# Patient Record
Sex: Male | Born: 1985
Health system: Southern US, Community
[De-identification: ages and names within clinical notes are randomized; demographics above are authoritative.]

## PROBLEM LIST (undated history)

## (undated) DIAGNOSIS — D693 Immune thrombocytopenic purpura: Secondary | ICD-10-CM

## (undated) DIAGNOSIS — G4730 Sleep apnea, unspecified: Secondary | ICD-10-CM

## (undated) DIAGNOSIS — Z789 Other specified health status: Secondary | ICD-10-CM

## (undated) DIAGNOSIS — D689 Coagulation defect, unspecified: Secondary | ICD-10-CM

## (undated) HISTORY — DX: Other specified health status: Z78.9

## (undated) HISTORY — DX: Coagulation defect, unspecified: D68.9

## (undated) HISTORY — DX: Sleep apnea, unspecified: G47.30

## (undated) HISTORY — PX: TONSILLECTOMY: SUR1361

## (undated) HISTORY — DX: Immune thrombocytopenic purpura: D69.3

---
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
## 2009-01-17 ENCOUNTER — Emergency Department (HOSPITAL_COMMUNITY): Admission: EM | Admit: 2009-01-17 | Discharge: 2009-01-18 | Payer: Self-pay | Admitting: Emergency Medicine

## 2009-01-17 ENCOUNTER — Emergency Department (HOSPITAL_COMMUNITY): Admission: EM | Admit: 2009-01-17 | Discharge: 2009-01-17 | Payer: Self-pay | Admitting: Emergency Medicine

## 2009-01-22 ENCOUNTER — Ambulatory Visit (HOSPITAL_COMMUNITY): Admission: RE | Admit: 2009-01-22 | Discharge: 2009-01-22 | Payer: Self-pay | Admitting: Family Medicine

## 2010-11-22 LAB — CBC
HCT: 42.2 % (ref 39.0–52.0)
MCV: 84 fL (ref 78.0–100.0)
RBC: 5.02 MIL/uL (ref 4.22–5.81)
WBC: 9.7 10*3/uL (ref 4.0–10.5)

## 2010-11-22 LAB — COMPREHENSIVE METABOLIC PANEL
AST: 24 U/L (ref 0–37)
BUN: 10 mg/dL (ref 6–23)
CO2: 25 mEq/L (ref 19–32)
Chloride: 106 mEq/L (ref 96–112)
Creatinine, Ser: 0.9 mg/dL (ref 0.4–1.5)
GFR calc non Af Amer: >60 mL/min (ref >60)
Total Bilirubin: 0.8 mg/dL (ref 0.3–1.2)

## 2010-11-22 LAB — DIFFERENTIAL
Basophils Absolute: 0 10*3/uL (ref 0.0–0.1)
Basophils Relative: 0 % (ref 0–1)
Eosinophils Relative: 0 % (ref 0–5)
Lymphocytes Relative: 6 % — ABNORMAL LOW (ref 12–46)
Neutro Abs: 8.7 10*3/uL — ABNORMAL HIGH (ref 1.7–7.7)

## 2010-11-22 LAB — URINALYSIS, ROUTINE W REFLEX MICROSCOPIC
Bilirubin Urine: NEGATIVE
Ketones, ur: NEGATIVE mg/dL
Nitrite: NEGATIVE
Specific Gravity, Urine: 1.02 (ref 1.005–1.030)
Urobilinogen, UA: 0.2 mg/dL (ref 0.0–1.0)

## 2010-11-22 LAB — LIPASE, BLOOD: Lipase: 19 U/L (ref 11–59)

## 2014-03-20 ENCOUNTER — Encounter: Payer: Self-pay | Admitting: Family Medicine

## 2017-08-30 ENCOUNTER — Ambulatory Visit (INDEPENDENT_AMBULATORY_CARE_PROVIDER_SITE_OTHER): Payer: BLUE CROSS/BLUE SHIELD | Admitting: Family Medicine

## 2017-08-30 ENCOUNTER — Encounter: Payer: Self-pay | Admitting: Family Medicine

## 2017-08-30 VITALS — BP 128/80

## 2017-08-30 DIAGNOSIS — Z0000 Encounter for general adult medical examination without abnormal findings: Secondary | ICD-10-CM

## 2017-08-30 DIAGNOSIS — R748 Abnormal levels of other serum enzymes: Secondary | ICD-10-CM

## 2017-08-30 NOTE — Progress Notes (Signed)
   Subjective:    Patient ID: Mark Curry, male    DOB: 05/26/1986, 32 y.o.   MRN: [id number]  HPI The patient comes in today for a wellness visit.    A review of their health history was completed.  A review of medications was also completed.  Any needed refills; not taking any meds  Eating habits: health conscious  Falls/  MVA accidents in past few months: none  Regular exercise: occasionally walking  Specialist pt sees on regular basis: none  Preventative health issues were discussed.   Additional concerns: risk of metabolic syndrome Family history of insulin resistance Based upon his family history personal history he is at risk of diabetes but current lab work looks good   Review of Systems  Constitutional: Negative for activity change, appetite change and fever.  HENT: Negative for congestion and rhinorrhea.   Eyes: Negative for discharge.  Respiratory: Negative for cough and wheezing.   Cardiovascular: Negative for chest pain.  Gastrointestinal: Negative for abdominal pain, blood in stool and vomiting.  Genitourinary: Negative for difficulty urinating and frequency.  Musculoskeletal: Negative for neck pain.  Skin: Negative for rash.  Allergic/Immunologic: Negative for environmental allergies and food allergies.  Neurological: Negative for weakness and headaches.  Psychiatric/Behavioral: Negative for agitation.       Objective:   Physical Exam  Constitutional: He appears well-developed and well-nourished.  HENT:  Head: Normocephalic and atraumatic.  Right Ear: External ear normal.  Left Ear: External ear normal.  Nose: Nose normal.  Mouth/Throat: Oropharynx is clear and moist.  Eyes: EOM are normal. Pupils are equal, round, and reactive to light.  Neck: Normal range of motion. Neck supple. No thyromegaly present.  Cardiovascular: Normal rate, regular rhythm and normal heart sounds.  No murmur heard. Pulmonary/Chest: Effort normal and breath sounds  normal. No respiratory distress. He has no wheezes.  Abdominal: Soft. Bowel sounds are normal. He exhibits no distension and no mass. There is no tenderness.  Genitourinary: Penis normal.  Musculoskeletal: Normal range of motion. He exhibits no edema.  Lymphadenopathy:    He has no cervical adenopathy.  Neurological: He is alert. He exhibits normal muscle tone.  Skin: Skin is warm and dry. No erythema.  Psychiatric: He has a normal mood and affect. His behavior is normal. Judgment normal.     Genitourinary exam normal Prostate exam not indicated     Assessment & Plan:  Adult wellness-complete.wellness physical was conducted today. Importance of diet and exercise were discussed in detail. In addition to this a discussion regarding safety was also covered. We also reviewed over immunizations and gave recommendations regarding current immunization needed for age. In addition to this additional areas were also touched on including: Preventative health exams needed: Colonoscopy at age 32  Patient was advised yearly wellness exam We did discuss dietary measures regular physical exercise try to reduce weight screening labs through his workplace look good we will repeat additional lab work including kidney function liver function await the results  Patient will follow up on a yearly basis

## 2017-09-02 LAB — CBC WITH DIFFERENTIAL/PLATELET
BASOS ABS: 0 10*3/uL (ref 0.0–0.2)
Basos: 0 %
EOS (ABSOLUTE): 0.3 10*3/uL (ref 0.0–0.4)
Eos: 5 %
Hematocrit: 47.1 % (ref 37.5–51.0)
Hemoglobin: 15.7 g/dL (ref 13.0–17.7)
IMMATURE GRANS (ABS): 0 10*3/uL (ref 0.0–0.1)
Immature Granulocytes: 0 %
LYMPHS ABS: 2.5 10*3/uL (ref 0.7–3.1)
LYMPHS: 46 %
MCH: 29.1 pg (ref 26.6–33.0)
MCHC: 33.3 g/dL (ref 31.5–35.7)
MCV: 87 fL (ref 79–97)
Monocytes Absolute: 0.3 10*3/uL (ref 0.1–0.9)
Monocytes: 5 %
NEUTROS ABS: 2.4 10*3/uL (ref 1.4–7.0)
Neutrophils: 44 %
PLATELETS: 199 10*3/uL (ref 150–379)
RBC: 5.4 x10E6/uL (ref 4.14–5.80)
RDW: 13.5 % (ref 12.3–15.4)
WBC: 5.6 10*3/uL (ref 3.4–10.8)

## 2017-09-02 LAB — BASIC METABOLIC PANEL
BUN / CREAT RATIO: 13 (ref 9–20)
BUN: 11 mg/dL (ref 6–20)
CHLORIDE: 102 mmol/L (ref 96–106)
CO2: 23 mmol/L (ref 20–29)
CREATININE: 0.88 mg/dL (ref 0.76–1.27)
Calcium: 9 mg/dL (ref 8.7–10.2)
GFR calc non Af Amer: 115 mL/min/{1.73_m2} (ref >59)
GFR, EST AFRICAN AMERICAN: 133 mL/min/{1.73_m2} (ref >59)
Glucose: 92 mg/dL (ref 65–99)
Potassium: 4.7 mmol/L (ref 3.5–5.2)
Sodium: 140 mmol/L (ref 134–144)

## 2017-09-02 LAB — HEPATIC FUNCTION PANEL
ALBUMIN: 4.4 g/dL (ref 3.5–5.5)
ALK PHOS: 63 IU/L (ref 39–117)
ALT: 49 IU/L — AB (ref 0–44)
AST: 44 IU/L — AB (ref 0–40)
BILIRUBIN TOTAL: 0.6 mg/dL (ref 0.0–1.2)
BILIRUBIN, DIRECT: 0.18 mg/dL (ref 0.00–0.40)
Total Protein: 7 g/dL (ref 6.0–8.5)

## 2017-09-06 LAB — SPECIMEN STATUS REPORT

## 2017-09-09 LAB — SPECIMEN STATUS REPORT

## 2017-09-09 LAB — LIPID PANEL W/O CHOL/HDL RATIO
Cholesterol, Total: 118 mg/dL (ref 100–199)
HDL: 35 mg/dL — AB (ref >39)
LDL Calculated: 59 mg/dL (ref 0–99)
TRIGLYCERIDES: 120 mg/dL (ref 0–149)
VLDL Cholesterol Cal: 24 mg/dL (ref 5–40)

## 2017-09-11 NOTE — Addendum Note (Signed)
Addended by: Margaretha SheffieldBROWN, Jadia Capers S on: 09/11/2017 03:11 PM   Modules accepted: Orders

## 2017-09-18 NOTE — Addendum Note (Signed)
Addended by: Meredith LeedsSUTTON, CRYSTAL L on: 09/18/2017 08:42 AM   Modules accepted: Orders

## 2017-09-19 ENCOUNTER — Ambulatory Visit: Payer: Self-pay | Admitting: Allergy and Immunology

## 2017-09-25 ENCOUNTER — Ambulatory Visit (HOSPITAL_COMMUNITY): Payer: Self-pay

## 2017-10-02 ENCOUNTER — Ambulatory Visit (HOSPITAL_COMMUNITY)
Admission: RE | Admit: 2017-10-02 | Discharge: 2017-10-02 | Disposition: A | Discharge status: Home / Self Care | Payer: BLUE CROSS/BLUE SHIELD | Source: Ambulatory Visit | Attending: Family Medicine | Admitting: Family Medicine

## 2017-10-02 DIAGNOSIS — K760 Fatty (change of) liver, not elsewhere classified: Secondary | ICD-10-CM | POA: Diagnosis not present

## 2017-10-02 DIAGNOSIS — R748 Abnormal levels of other serum enzymes: Secondary | ICD-10-CM | POA: Diagnosis present

## 2017-10-09 ENCOUNTER — Ambulatory Visit: Payer: BLUE CROSS/BLUE SHIELD | Admitting: Family Medicine

## 2017-10-09 ENCOUNTER — Encounter: Payer: Self-pay | Admitting: Family Medicine

## 2017-10-09 VITALS — BP 122/78 | Temp 98.7°F | Ht 72.0 in | Wt 287.0 lb

## 2017-10-09 DIAGNOSIS — K760 Fatty (change of) liver, not elsewhere classified: Secondary | ICD-10-CM | POA: Diagnosis not present

## 2017-10-09 DIAGNOSIS — J019 Acute sinusitis, unspecified: Secondary | ICD-10-CM

## 2017-10-09 MED ORDER — AMOXICILLIN 500 MG PO TABS: 500.0000 mg | ORAL_TABLET | Freq: Three times a day (TID) | ORAL | 0 refills | Status: DC

## 2017-10-09 NOTE — Progress Notes (Signed)
Discussed at office visit.

## 2017-10-09 NOTE — Progress Notes (Signed)
   Subjective:    Patient ID: Mark Curry, male    DOB: 04/26/1986, 32 y.o.   MRN: [id number]  HPIGo over ultrasound results.  Patient moderately overweight Patient does not drink alcohol Only occasional alcohol use Denies overeating but does state that his dietary measures could be better does relate a lot of sinus pressure pain discomfort drainage not feeling good symptoms over the past couple days Sinus pressure, general weakness, sore throat. Started 2 days ago.   Family history of diabetes  Review of Systems  Constitutional: Negative for activity change, chills and fever.  HENT: Positive for congestion, rhinorrhea, sinus pressure and sinus pain. Negative for ear pain.   Eyes: Negative for discharge.  Respiratory: Positive for cough. Negative for wheezing.   Cardiovascular: Negative for chest pain.  Gastrointestinal: Negative for nausea and vomiting.  Musculoskeletal: Negative for arthralgias.       Objective:   Physical Exam  Constitutional: He appears well-developed.  HENT:  Head: Normocephalic and atraumatic.  Mouth/Throat: Oropharynx is clear and moist. No oropharyngeal exudate.  Eyes: Right eye exhibits no discharge. Left eye exhibits no discharge.  Neck: Normal range of motion.  Cardiovascular: Normal rate, regular rhythm and normal heart sounds.  No murmur heard. Pulmonary/Chest: Effort normal and breath sounds normal. No respiratory distress. He has no wheezes. He has no rales.  Lymphadenopathy:    He has no cervical adenopathy.  Neurological: He exhibits normal muscle tone.  Skin: Skin is warm and dry.  Nursing note and vitals reviewed.         Assessment & Plan:  We discussed fatty liver This healthy eating discussed Importance of losing weight Follow-up liver profile in approximately 5 months If still elevated will need additional lab work to rule out other more rare causes of elevated liver enzymes Patient was cautioned that addressed fatty liver  can significantly increase the risk of cirrhosis over time  Sinusitis antibiotic prescribed warnings discussed follow-up if problems

## 2017-10-31 ENCOUNTER — Ambulatory Visit: Payer: Self-pay | Admitting: Allergy and Immunology

## 2017-11-06 ENCOUNTER — Ambulatory Visit: Payer: BLUE CROSS/BLUE SHIELD | Admitting: Allergy and Immunology

## 2017-11-06 ENCOUNTER — Encounter: Payer: Self-pay | Admitting: Allergy and Immunology

## 2017-11-06 VITALS — BP 120/78 | HR 70 | Temp 97.9°F | Resp 16 | Ht 70.0 in | Wt 289.4 lb

## 2017-11-06 DIAGNOSIS — R430 Anosmia: Secondary | ICD-10-CM | POA: Insufficient documentation

## 2017-11-06 DIAGNOSIS — J3089 Other allergic rhinitis: Secondary | ICD-10-CM

## 2017-11-06 DIAGNOSIS — J309 Allergic rhinitis, unspecified: Secondary | ICD-10-CM | POA: Insufficient documentation

## 2017-11-06 MED ORDER — CARBINOXAMINE MALEATE 6 MG PO TABS: 1.0000 | ORAL_TABLET | ORAL | 5 refills | Status: DC

## 2017-11-06 MED ORDER — FLUTICASONE PROPIONATE 93 MCG/ACT NA EXHU: 2.0000 | INHALANT_SUSPENSION | Freq: Two times a day (BID) | NASAL | 5 refills | Status: DC

## 2017-11-06 NOTE — Patient Instructions (Addendum)
Perennial and seasonal allergic rhinitis  Aeroallergen avoidance measures have been discussed and provided in written form.  A prescription has been provided for [facility], 2 actuations per nostril twice a day. Proper technique has been discussed and demonstrated.  Nasal saline spray (i.e., Simply Saline) or nasal saline lavage (i.e., NeilMed) is recommended as needed and prior to medicated nasal sprays.  A prescription has been provided for RyVent (carbinoxamine maleate) 6mg  every 6-8 hours as needed.  If allergen avoidance measures and medications fail to adequately relieve symptoms, aeroallergen immunotherapy will be considered.  Anosmia Partial anosmia.  Timmothy SoursXhance has been prescribed (as above).  If this problem persists or progresses, we will consider imaging..   Return in about 4 months (around 03/08/2018), or if symptoms worsen or fail to improve.

## 2017-11-06 NOTE — Assessment & Plan Note (Signed)
Partial anosmia.  Mark SoursXhance has been prescribed (as above).  If this problem persists or progresses, we will consider imaging..Marland Kitchen

## 2017-11-06 NOTE — Progress Notes (Signed)
New Patient Note  RE: BURDELL Curry MRN: [id number] DOB: 1986-07-10 Date of Office Visit: 11/06/2017  Referring provider: Babs Sciara, MD Primary care provider: Babs Sciara, MD  Chief Complaint: Nasal Congestion   History of present illness: Mark Curry is a 32 y.o. male seen today in consultation requested by Lilyan Punt, MD.  He experiences persistent nasal congestion, snoring, and occasional postnasal drainage.  No significant seasonal symptom variation has been noted nor have specific environmental triggers been identified.  He has tried some medicated nasal spray in the past but is currently not using a nasal spray.  He admits to a diminished sense of smell and taste.  He denies ocular symptoms, nasal pruritus, sneezing, and rhinorrhea.  Assessment and plan: Perennial and seasonal allergic rhinitis  Aeroallergen avoidance measures have been discussed and provided in written form.  A prescription has been provided for [facility], 2 actuations per nostril twice a day. Proper technique has been discussed and demonstrated.  Nasal saline spray (i.e., Simply Saline) or nasal saline lavage (i.e., NeilMed) is recommended as needed and prior to medicated nasal sprays.  A prescription has been provided for RyVent (carbinoxamine maleate) 6mg  every 6-8 hours as needed.  If allergen avoidance measures and medications fail to adequately relieve symptoms, aeroallergen immunotherapy will be considered.  Anosmia Partial anosmia.  Mark Curry has been prescribed (as above).  If this problem persists or progresses, we will consider imaging..   Meds ordered this encounter  Medications  . Fluticasone Propionate (XHANCE) 93 MCG/ACT EXHU    Sig: Place 2 sprays into both nostrils 2 (two) times daily.    Dispense:  32 mL    Refill:  5    [phone]  . Carbinoxamine Maleate (RYVENT) 6 MG TABS    Sig: Take 1 tablet by mouth See admin instructions. Every 6-8 hours as needed    Dispense:   30 tablet    Refill:  5    RUN AS CASH PAY ONLY! BIN  [id number]  RXPCN  [id number]  Group  [id number]  CardholderID  [id number]    Diagnostics: Epicutaneous testing: Negative despite a positive histamine control. Intradermal testing: Positive to grass pollen, molds, and dust mite antigen.    Physical examination: Blood pressure 120/78, pulse 70, temperature 97.9 F (36.6 C), temperature source Oral, resp. rate 16, height 5\' 10"  (1.778 m), weight 289 lb 6.4 oz (131.3 kg), SpO2 96 %.  General: Alert, interactive, in no acute distress. HEENT: TMs pearly gray, turbinates edematous without discharge, post-pharynx mildly erythematous. Neck: Supple without lymphadenopathy. Lungs: Clear to auscultation without wheezing, rhonchi or rales. CV: Normal S1, S2 without murmurs. Abdomen: Nondistended, nontender. Skin: Warm and dry, without lesions or rashes. Extremities:  No clubbing, cyanosis or edema. Neuro:   Grossly intact.  Review of systems:  Review of systems negative except as noted in HPI / PMHx or noted below: Review of Systems  Constitutional: Negative.   HENT: Negative.   Eyes: Negative.   Respiratory: Negative.   Cardiovascular: Negative.   Gastrointestinal: Negative.   Genitourinary: Negative.   Musculoskeletal: Negative.   Skin: Negative.   Neurological: Negative.   Endo/Heme/Allergies: Negative.   Psychiatric/Behavioral: Negative.     Past medical history:  History reviewed. No pertinent past medical history.  Past surgical history:  Past Surgical History:  Procedure Laterality Date  . TONSILLECTOMY      Family history: Family History  Problem Relation Age of Onset  . Polycystic ovary syndrome Mother   .  Diabetes Father   . Insulin resistance Sister   . Multiple sclerosis Maternal Grandmother   . Allergic rhinitis Brother   . Angioedema Neg Hx   . Asthma Neg Hx   . Eczema Neg Hx   . Immunodeficiency Neg Hx   . Urticaria Neg Hx     Social history: Social History     Socioeconomic History  . Marital status: Single    Spouse name: Not on file  . Number of children: Not on file  . Years of education: Not on file  . Highest education level: Not on file  Occupational History  . Not on file  Social Needs  . Financial resource strain: Not on file  . Food insecurity:    Worry: Not on file    Inability: Not on file  . Transportation needs:    Medical: Not on file    Non-medical: Not on file  Tobacco Use  . Smoking status: Never Smoker  . Smokeless tobacco: Never Used  Substance and Sexual Activity  . Alcohol use: Not on file  . Drug use: Not on file  . Sexual activity: Not on file  Lifestyle  . Physical activity:    Days per week: Not on file    Minutes per session: Not on file  . Stress: Not on file  Relationships  . Social connections:    Talks on phone: Not on file    Gets together: Not on file    Attends religious service: Not on file    Active member of club or organization: Not on file    Attends meetings of clubs or organizations: Not on file    Relationship status: Not on file  . Intimate partner violence:    Fear of current or ex partner: Not on file    Emotionally abused: Not on file    Physically abused: Not on file    Forced sexual activity: Not on file  Other Topics Concern  . Not on file  Social History Narrative  . Not on file   Environmental History: The patient lives in house with carpeting the bedroom, central air, and window air conditioning units.  There is a dog in the home which has access to his bedroom.  He is a non-smoker.  There is no known mold/water damage in the home.  Allergies as of 11/06/2017   No Known Allergies     Medication List        Accurate as of 11/06/17  8:45 PM. Always use your most recent med list.          amoxicillin 500 MG tablet Commonly known as:  AMOXIL Take 1 tablet (500 mg total) by mouth 3 (three) times daily.   Carbinoxamine Maleate 6 MG Tabs Commonly known as:   RYVENT Take 1 tablet by mouth See admin instructions. Every 6-8 hours as needed   Fluticasone Propionate 93 MCG/ACT Exhu Commonly known as:  XHANCE Place 2 sprays into both nostrils 2 (two) times daily.       Known medication allergies: No Known Allergies  I appreciate the opportunity to take part in Keaun's care. Please do not hesitate to contact me with questions.  Sincerely,   R. Jorene Guestarter Mateya Torti, MD

## 2017-11-06 NOTE — Assessment & Plan Note (Signed)
   Aeroallergen avoidance measures have been discussed and provided in written form.  A prescription [facility], 2 actuations per nostril twice a day. Proper technique has been discussed and demonstrated.  Nasal saline spray (i.e., Simply Saline) or nasal saline lavage (i.e., NeilMed) is recommended as needed and prior to medicated nasal sprays.  A prescription has been provided for RyVent (carbinoxamine maleate) 6mg  every 6-8 hours as needed.  If allergen avoidance measures and medications fail to adequately relieve symptoms, aeroallergen immunotherapy will be considered.

## 2017-11-07 ENCOUNTER — Telehealth: Payer: Self-pay | Admitting: Allergy and Immunology

## 2017-11-07 NOTE — Telephone Encounter (Signed)
Informed pt with number to [facility] and to given them a call about shipment

## 2017-11-07 NOTE — Telephone Encounter (Signed)
Patient was seen yesterday, 11-06-17, by Dr. Nunzio CobbsBobbitt and was prescribed SiloXhance. He went to pick up all meds and this was not there. I told him I thought it had to be ordered, not sure, but someone would call him back to explain how he can get this.

## 2017-11-07 NOTE — Telephone Encounter (Signed)
Called pt and lm for him to call us back. Yes xhance is thru a mail order pharmacy

## 2017-12-18 ENCOUNTER — Encounter: Payer: Self-pay | Admitting: Family Medicine

## 2017-12-18 ENCOUNTER — Ambulatory Visit: Payer: BLUE CROSS/BLUE SHIELD | Admitting: Family Medicine

## 2017-12-18 VITALS — BP 132/84 | Temp 98.5°F | Ht 70.0 in | Wt 285.0 lb

## 2017-12-18 DIAGNOSIS — J029 Acute pharyngitis, unspecified: Secondary | ICD-10-CM

## 2017-12-18 DIAGNOSIS — J019 Acute sinusitis, unspecified: Secondary | ICD-10-CM

## 2017-12-18 LAB — POCT RAPID STREP A (OFFICE): RAPID STREP A SCREEN: NEGATIVE

## 2017-12-18 NOTE — Progress Notes (Signed)
   Subjective:    Patient ID: Mark Curry, male    DOB: Jun 03, 1986, 32 y.o.   MRN: [id number]  Sore Throat   This is a new problem. The current episode started in the past 7 days. Associated symptoms include congestion, coughing and headaches. Pertinent negatives include no ear pain or vomiting. He has tried NSAIDs for the symptoms.      Review of Systems  Constitutional: Negative for activity change, chills and fever.  HENT: Positive for congestion and rhinorrhea. Negative for ear pain.   Eyes: Negative for discharge.  Respiratory: Positive for cough. Negative for wheezing.   Cardiovascular: Negative for chest pain.  Gastrointestinal: Negative for nausea and vomiting.  Musculoskeletal: Negative for arthralgias.  Neurological: Positive for headaches.       Objective:   Physical Exam  Constitutional: He appears well-developed.  HENT:  Head: Normocephalic.  Mouth/Throat: Oropharynx is clear and moist. No oropharyngeal exudate.  Neck: Normal range of motion.  Cardiovascular: Normal rate, regular rhythm and normal heart sounds.  No murmur heard. Pulmonary/Chest: Effort normal and breath sounds normal. He has no wheezes.  Lymphadenopathy:    He has no cervical adenopathy.  Neurological: He exhibits normal muscle tone.  Skin: Skin is warm and dry.  Nursing note and vitals reviewed.         Assessment & Plan:  Viral syndrome Rapid strep negative Hold off on antibiotics currently Supportive measures discussed Follow-up if ongoing troubles or worse may need to call in antibiotics if worse over the next few days

## 2017-12-19 LAB — SPECIMEN STATUS REPORT

## 2017-12-19 LAB — STREP A DNA PROBE: Strep Gp A Direct, DNA Probe: NEGATIVE

## 2017-12-22 ENCOUNTER — Telehealth: Payer: Self-pay | Admitting: Family Medicine

## 2017-12-22 ENCOUNTER — Other Ambulatory Visit: Payer: Self-pay | Admitting: Family Medicine

## 2017-12-22 MED ORDER — AMOXICILLIN-POT CLAVULANATE 875-125 MG PO TABS: 1.0000 | ORAL_TABLET | Freq: Two times a day (BID) | ORAL | 0 refills | Status: AC

## 2017-12-22 MED ORDER — SULFACETAMIDE SODIUM 10 % OP SOLN: OPHTHALMIC | 0 refills | Status: DC

## 2017-12-22 NOTE — Telephone Encounter (Signed)
Pt contacted and informed that med is being sent in. (Affected eye is right eye)

## 2017-12-22 NOTE — Telephone Encounter (Signed)
At this point what I would recommend is Augmentin 875 mg 1 twice daily for 10 days take with a snack to lessen the chance of GI side effects.  Sometimes can cause loose stools-if excessive may need to change medication.  Also may use sulfacetamide eyedrop, will 1 drop 3 times daily in the affected eye for 3 to 5 days-if ongoing troubles please follow-up

## 2017-12-22 NOTE — Telephone Encounter (Signed)
Patient seen Dr. Lorin Picket on 12/18/17 for pharyngitis.  He was told to call back today if no better.  He is still having sore throat and congestion.  Also, he has developed pink eye.  Please advise.  [facility] on [address]

## 2018-01-23 ENCOUNTER — Telehealth: Payer: Self-pay | Admitting: Allergy and Immunology

## 2018-01-23 DIAGNOSIS — J3089 Other allergic rhinitis: Secondary | ICD-10-CM

## 2018-01-23 DIAGNOSIS — R430 Anosmia: Secondary | ICD-10-CM

## 2018-01-23 MED ORDER — FLUTICASONE PROPIONATE 93 MCG/ACT NA EXHU: 2.0000 | INHALANT_SUSPENSION | Freq: Two times a day (BID) | NASAL | 5 refills | Status: DC

## 2018-01-23 NOTE — Telephone Encounter (Signed)
Pt called and said that his ins change from [employer] to [facility] . He wants to know if ins will cover the xhance. His number [phone].

## 2018-01-23 NOTE — Telephone Encounter (Signed)
Prescription refilled to [facility]. I tried to let patient know but the voicemail was full.

## 2018-01-24 NOTE — Telephone Encounter (Signed)
Tried to call the patient. There was no answer and the mailbox is full. Will try back again later.

## 2018-01-26 ENCOUNTER — Other Ambulatory Visit: Payer: Self-pay | Admitting: *Deleted

## 2018-01-26 DIAGNOSIS — J3089 Other allergic rhinitis: Secondary | ICD-10-CM

## 2018-01-26 MED ORDER — CARBINOXAMINE MALEATE 6 MG PO TABS: 1.0000 | ORAL_TABLET | ORAL | 5 refills | Status: DC

## 2018-03-06 ENCOUNTER — Ambulatory Visit (INDEPENDENT_AMBULATORY_CARE_PROVIDER_SITE_OTHER): Payer: PRIVATE HEALTH INSURANCE | Admitting: Allergy and Immunology

## 2018-03-06 ENCOUNTER — Encounter: Payer: Self-pay | Admitting: Allergy and Immunology

## 2018-03-06 VITALS — BP 110/90 | HR 70 | Temp 98.1°F | Resp 16

## 2018-03-06 DIAGNOSIS — R430 Anosmia: Secondary | ICD-10-CM | POA: Diagnosis not present

## 2018-03-06 DIAGNOSIS — J3089 Other allergic rhinitis: Secondary | ICD-10-CM

## 2018-03-06 MED ORDER — AZELASTINE HCL 0.1 % NA SOLN: NASAL | 5 refills | Status: DC

## 2018-03-06 MED ORDER — AZELASTINE HCL 0.15 % NA SOLN: 2.0000 | Freq: Two times a day (BID) | NASAL | 5 refills | Status: DC

## 2018-03-06 NOTE — Patient Instructions (Addendum)
Perennial and seasonal allergic rhinitis  Continue appropriate allergen avoidance measures.  For now, continue nasal saline irrigation and Xhance, 1 to 2 sprays per nostril twice daily.  A prescription has been provided for azelastine nasal spray, 1-2 sprays per nostril 2 times daily as needed.   Continue RyVent as needed.   Return in about 1 year (around 03/07/2019), or if symptoms worsen or fail to improve.

## 2018-03-06 NOTE — Assessment & Plan Note (Addendum)
   Continue appropriate allergen avoidance measures.  For now, continue nasal saline irrigation and Xhance, 1 to 2 sprays per nostril twice daily.  A prescription has been provided for azelastine nasal spray, 1-2 sprays per nostril 2 times daily as needed.   Continue RyVent as needed.

## 2018-03-06 NOTE — Progress Notes (Signed)
    Follow-up Note  RE: Mark HarderGabriel A Lomanto MRN: [id number] DOB: 10/09/1985 Date of Office Visit: 03/06/2018  Primary care provider: Babs SciaraLuking, Scott A, MD Referring provider: Babs SciaraLuking, Scott A, MD  History of present illness: Mark Curry is a 32 y.o. male with allergic rhinitis and partial anosmia presenting today for follow-up.  He is previously seen in this clinic for his initial evaluation on November 06, 2017.  He reports that while taking [facility] and RyVent his nasal congestion and sense of smell have improved significantly.  Despite the improvement, he still experiences a lesser degree of persistent congestion.  Assessment and plan: Perennial and seasonal allergic rhinitis  Continue appropriate allergen avoidance measures.  For now, continue nasal saline irrigation and Xhance, 1 to 2 sprays per nostril twice daily.  A prescription has been provided for azelastine nasal spray, 1-2 sprays per nostril 2 times daily as needed.   Continue RyVent as needed.   Meds ordered this encounter  Medications  . DISCONTD: azelastine (ASTELIN) 0.1 % nasal spray    Sig: Use 1-2 sprays per nostril twice daily    Dispense:  30 mL    Refill:  5  . Azelastine HCl 0.15 % SOLN    Sig: Place 2 sprays into both nostrils 2 (two) times daily.    Dispense:  30 mL    Refill:  5    Physical examination: Blood pressure 110/90, pulse 70, temperature 98.1 F (36.7 C), temperature source Oral, resp. rate 16.  General: Alert, interactive, in no acute distress. HEENT: TMs pearly gray, turbinates mildly edematous without discharge, post-pharynx unremarkable. Neck: Supple without lymphadenopathy. Lungs: Clear to auscultation without wheezing, rhonchi or rales. CV: Normal S1, S2 without murmurs. Skin: Warm and dry, without lesions or rashes.  The following portions of the patient's history were reviewed and updated as appropriate: allergies, current medications, past family history, past medical history, past  social history, past surgical history and problem list.  Allergies as of 03/06/2018   No Known Allergies     Medication List        Accurate as of 03/06/18 12:15 PM. Always use your most recent med list.          Azelastine HCl 0.15 % Soln Place 2 sprays into both nostrils 2 (two) times daily.   Carbinoxamine Maleate 6 MG Tabs Commonly known as:  RYVENT Take 1 tablet by mouth See admin instructions. Every 6-8 hours as needed   Fluticasone Propionate 93 MCG/ACT Exhu Commonly known as:  XHANCE Place 2 sprays into both nostrils 2 (two) times daily.       No Known Allergies  I appreciate the opportunity to take part in Illya's care. Please do not hesitate to contact me with questions.  Sincerely,   R. Jorene Guestarter Keland Peyton, MD

## 2018-03-15 ENCOUNTER — Encounter: Payer: Self-pay | Admitting: Family Medicine

## 2018-03-15 ENCOUNTER — Ambulatory Visit: Payer: PRIVATE HEALTH INSURANCE | Admitting: Family Medicine

## 2018-03-15 DIAGNOSIS — K760 Fatty (change of) liver, not elsewhere classified: Secondary | ICD-10-CM | POA: Diagnosis not present

## 2018-03-15 DIAGNOSIS — R748 Abnormal levels of other serum enzymes: Secondary | ICD-10-CM | POA: Diagnosis not present

## 2018-03-15 LAB — HEPATIC FUNCTION PANEL
ALT: 46 IU/L — ABNORMAL HIGH (ref 0–44)
AST: 32 IU/L (ref 0–40)
Albumin: 4.7 g/dL (ref 3.5–5.5)
Alkaline Phosphatase: 66 IU/L (ref 39–117)
BILIRUBIN, DIRECT: 0.1 mg/dL (ref 0.00–0.40)
Bilirubin Total: 0.4 mg/dL (ref 0.0–1.2)
TOTAL PROTEIN: 7.4 g/dL (ref 6.0–8.5)

## 2018-03-15 NOTE — Progress Notes (Signed)
   Subjective:    Patient ID: Mark Curry, male    DOB: 03/21/1986, 32 y.o.   MRN: [id number]  HPI Patient arrives for a follow up on recent blood work. Patient has elevated liver enzyme Has significant obesity issues Unable to exercise much because of schedule is doing some exercise Trying to watch diet to some degree Denies alcohol use Has history of elevated liver enzyme Has history of fatty liver We talked about the importance of activity health  15 minutes was spent with patient today discussing healthcare issues which they came.  More than 50% of this visit-total duration of visit-was spent in counseling and coordination of care.  Please see diagnosis regarding the focus of this coordination and care   Review of Systems  Constitutional: Negative for activity change, fatigue and fever.  HENT: Negative for congestion and rhinorrhea.   Respiratory: Negative for cough and shortness of breath.   Cardiovascular: Negative for chest pain and leg swelling.  Gastrointestinal: Negative for abdominal pain, diarrhea and nausea.  Genitourinary: Negative for dysuria and hematuria.  Neurological: Negative for weakness and headaches.  Psychiatric/Behavioral: Negative for agitation and behavioral problems.       Objective:   Physical Exam  Constitutional: He appears well-nourished. No distress.  HENT:  Head: Normocephalic and atraumatic.  Eyes: Right eye exhibits no discharge. Left eye exhibits no discharge.  Neck: No tracheal deviation present.  Cardiovascular: Normal rate, regular rhythm and normal heart sounds.  No murmur heard. Pulmonary/Chest: Effort normal and breath sounds normal. No respiratory distress.  Musculoskeletal: He exhibits no edema.  Lymphadenopathy:    He has no cervical adenopathy.  Neurological: He is alert. Coordination normal.  Skin: Skin is warm and dry.  Psychiatric: He has a normal mood and affect. His behavior is normal.  Vitals reviewed.    Liver  enzyme does not look as bad as what it did look we will relook at this again in the spring     Assessment & Plan:  Fatty liver Work on [employer]weight Work on diet Exercise No medications Check lab work by spring time Significant obesity Talked about surgical options these are last resort for the patient

## 2018-07-05 ENCOUNTER — Other Ambulatory Visit: Payer: Self-pay | Admitting: Allergy and Immunology

## 2018-07-05 DIAGNOSIS — J3089 Other allergic rhinitis: Secondary | ICD-10-CM

## 2018-07-05 DIAGNOSIS — R430 Anosmia: Secondary | ICD-10-CM

## 2018-08-02 ENCOUNTER — Telehealth: Payer: Self-pay | Admitting: *Deleted

## 2018-08-02 NOTE — Telephone Encounter (Signed)
Called but was unable to leave message due to mailbox being full. 

## 2018-08-02 NOTE — Telephone Encounter (Signed)
Patient called stating he was on  auto refill with [facility] patient would like for the order to be cancelled.

## 2018-08-02 NOTE — Telephone Encounter (Signed)
Patient will need to contact his pharmacy and have them cancel this.

## 2018-08-06 NOTE — Telephone Encounter (Signed)
Second attempt to contact patient. Unable to leave message due to VM being full.  Will try one more time before mailing unable to contact letter.

## 2018-08-25 IMAGING — US US ABDOMEN LIMITED
1 series · 14 of 25 positions shown · non-contrast
Comparison: CT abdomen pelvis dated January 18, 2009.

CLINICAL DATA: Elevated LFTs.

EXAM:
ULTRASOUND ABDOMEN LIMITED RIGHT UPPER QUADRANT

[Series 1: us abdomen limited · 0.25mm/px · 14 of 75 slices shown]
[im 1/75]
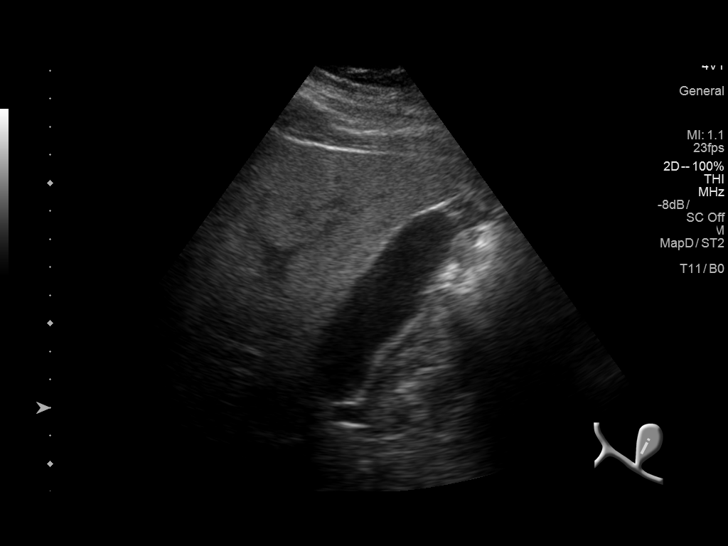
[im 7/75]
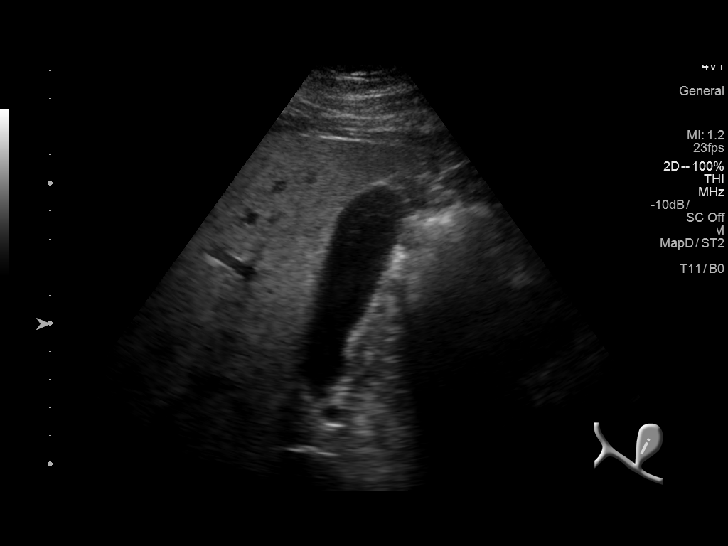
[im 13/75]
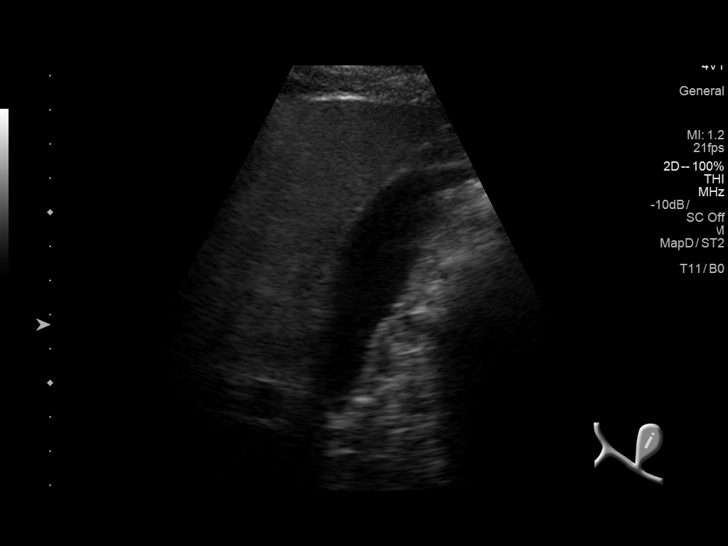
[im 19/75]
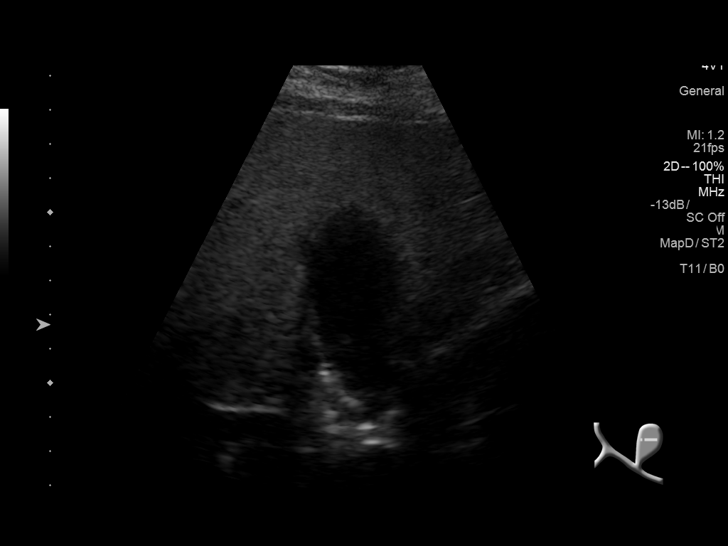
[im 25/75]
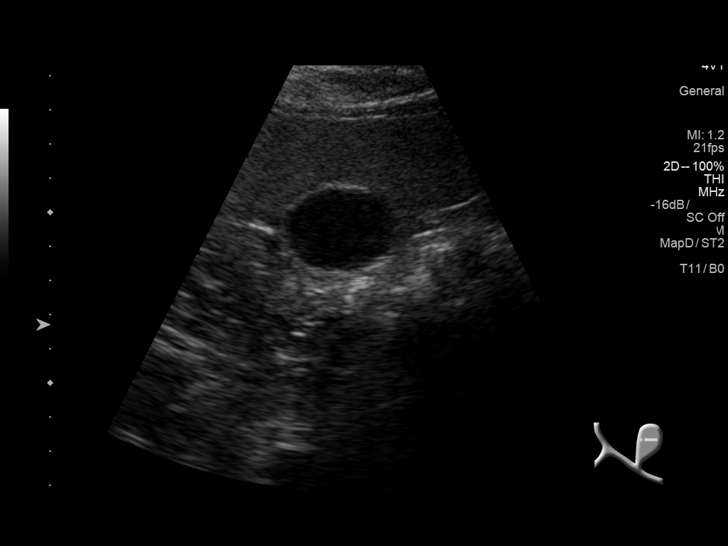
[im 28/75]
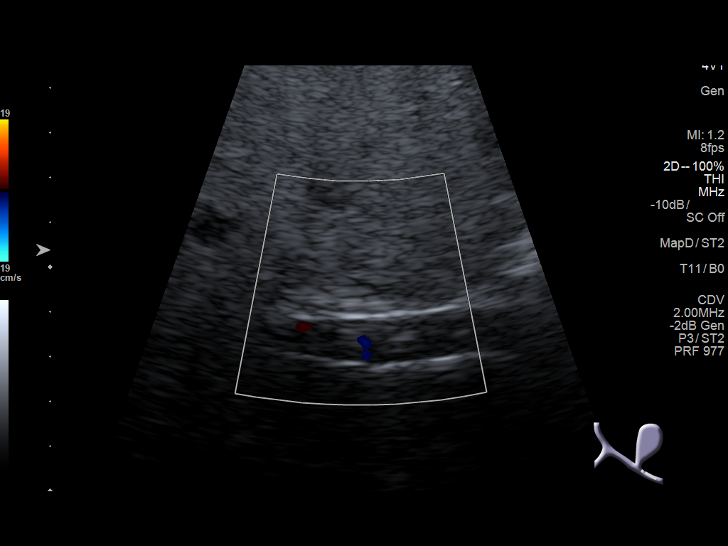
[im 34/75]
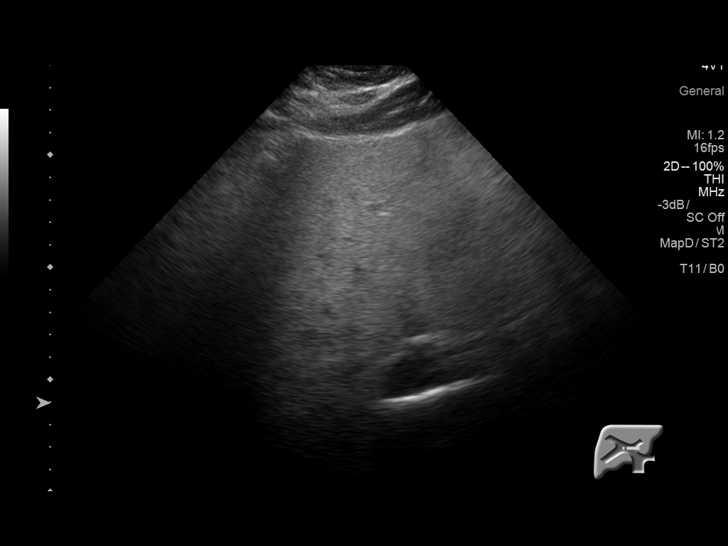
[im 41/75]
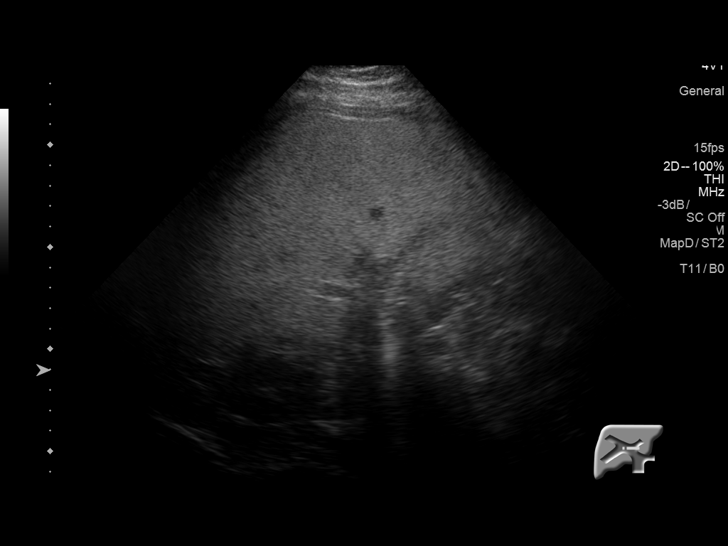
[im 47/75]
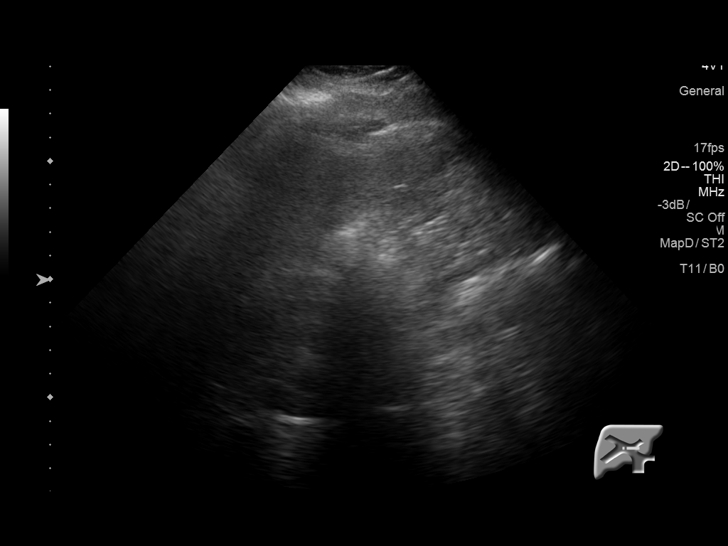
[im 50/75]
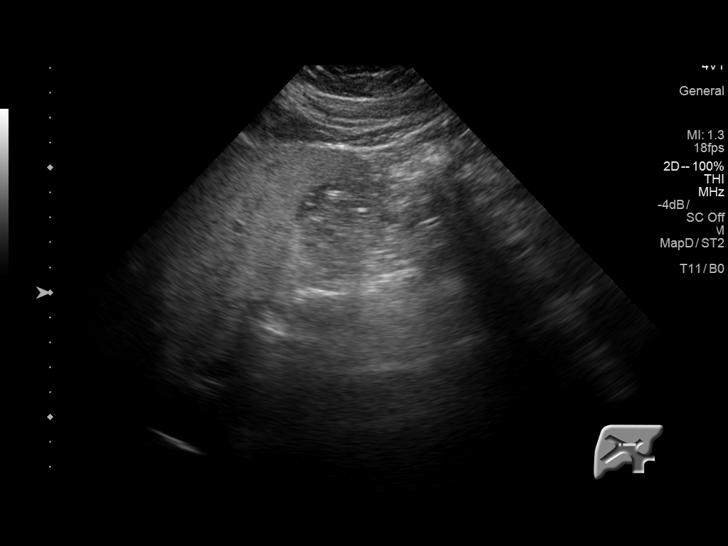
[im 56/75]
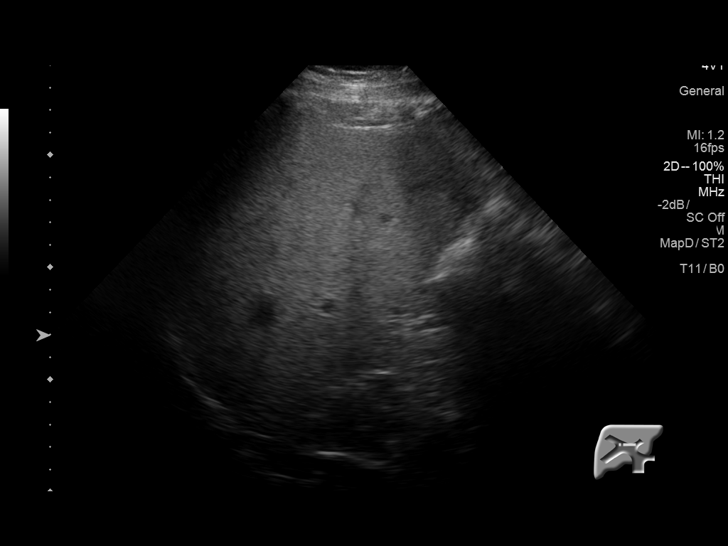
[im 62/75]
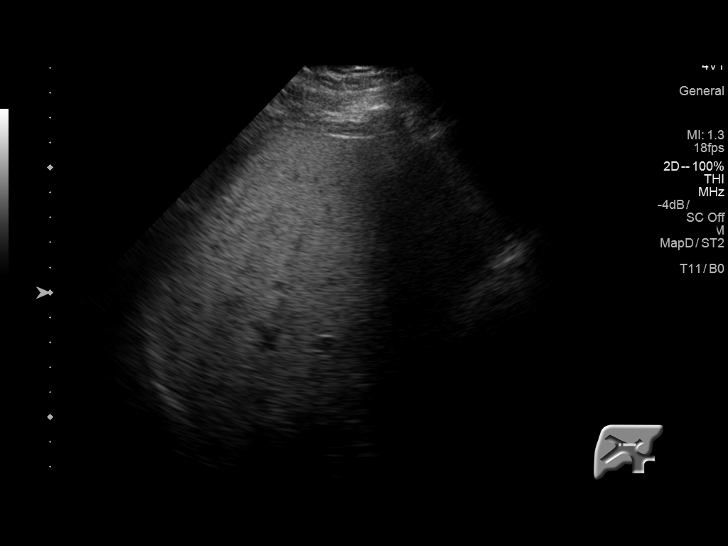
[im 68/75]
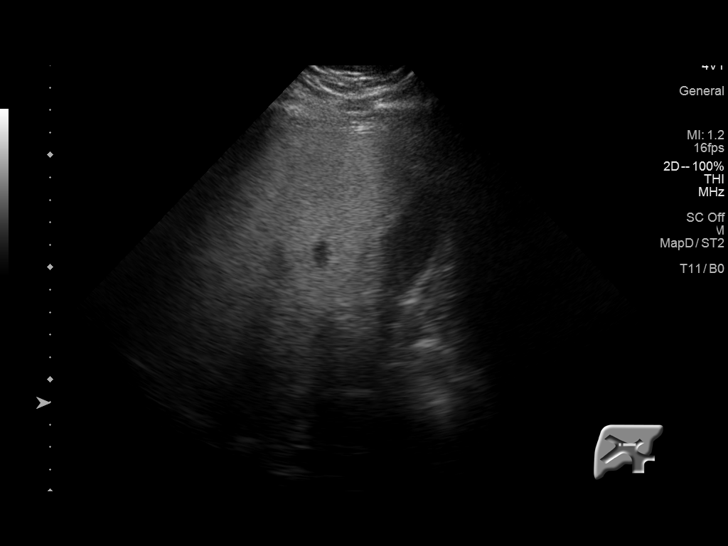
[im 75/75]
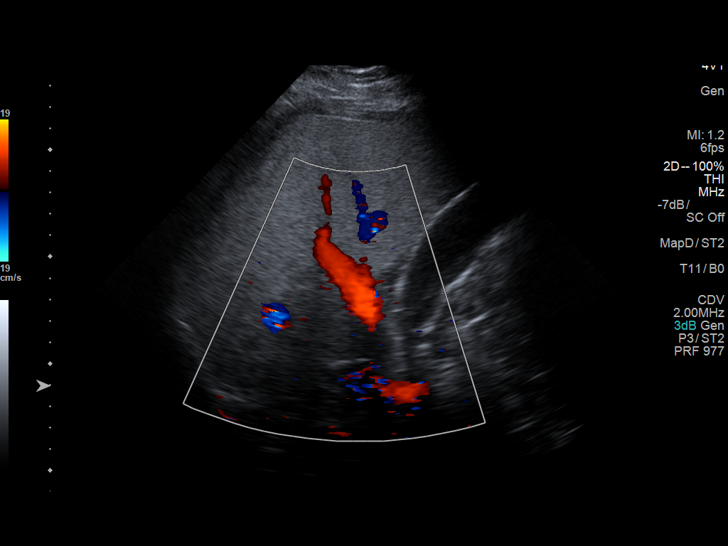

[14 of 25 positions shown; findings below may reference images not displayed]

FINDINGS: Gallbladder:

No gallstones or wall thickening visualized. No sonographic Murphy
sign noted by sonographer.

Common bile duct:

Diameter: 2 mm, normal.

Liver:

No focal lesion identified. Diffusely increased in parenchymal
echogenicity. Portal vein is patent on color Doppler imaging with
normal direction of blood flow towards the liver.
IMPRESSION: 1. Hepatic steatosis.  No acute abnormality.

## 2019-02-27 ENCOUNTER — Other Ambulatory Visit: Payer: Self-pay

## 2019-02-28 ENCOUNTER — Telehealth: Payer: Self-pay | Admitting: Family Medicine

## 2019-02-28 ENCOUNTER — Ambulatory Visit (INDEPENDENT_AMBULATORY_CARE_PROVIDER_SITE_OTHER): Payer: PRIVATE HEALTH INSURANCE | Admitting: Family Medicine

## 2019-02-28 ENCOUNTER — Other Ambulatory Visit: Payer: Self-pay

## 2019-02-28 ENCOUNTER — Other Ambulatory Visit: Payer: BLUE CROSS/BLUE SHIELD

## 2019-02-28 ENCOUNTER — Telehealth: Payer: Self-pay | Admitting: *Deleted

## 2019-02-28 DIAGNOSIS — R6889 Other general symptoms and signs: Secondary | ICD-10-CM | POA: Diagnosis not present

## 2019-02-28 DIAGNOSIS — Z20822 Contact with and (suspected) exposure to covid-19: Secondary | ICD-10-CM

## 2019-02-28 DIAGNOSIS — J029 Acute pharyngitis, unspecified: Secondary | ICD-10-CM | POA: Diagnosis not present

## 2019-02-28 NOTE — Telephone Encounter (Signed)
Pt saw Dr.Steve virtually this morning and is needing COVID 19 testing. Pt has been advised to be at testing site before 3:30 pm today.

## 2019-02-28 NOTE — Progress Notes (Signed)
   Subjective:    Patient ID: Mark Curry, male    DOB: 08-07-1986, 33 y.o.   MRN: [id number] Audio plus video Sore Throat  This is a new problem. Episode onset: night before last  Associated symptoms include congestion and headaches. Associated symptoms comments: sneezing. Treatments tried: DayQuil, Mucinex.  Patient notes congestion.  Couple days duration.  Headache off and on.  Sneezing off and on.  Sore throat.  Worse in the morning.  DayQuil and NyQuil has helped.  Occasional cough.  No shortness of breath  Virtual Visit via Video Note  I connected with Mark Curry on 02/28/19 at  9:30 AM EDT by a video enabled telemedicine application and verified that I am speaking with the correct person using two identifiers.  Location: Patient: home Provider: office   I discussed the limitations of evaluation and management by telemedicine and the availability of in person appointments. The patient expressed understanding and agreed to proceed.  History of Present Illness:    Observations/Objective:   Assessment and Plan:   Follow Up Instructions:    I discussed the assessment and treatment plan with the patient. The patient was provided an opportunity to ask questions and all were answered. The patient agreed with the plan and demonstrated an understanding of the instructions.   The patient was advised to call back or seek an in-person evaluation if the symptoms worsen or if the condition fails to improve as anticipated.  I provided25 minutes of non-face-to-face time during this encounter.   Vicente Males, LPN   Review of Systems  HENT: Positive for congestion.   Neurological: Positive for headaches.  No rash     Objective:   Physical Exam   Virtual     Assessment & Plan:  Impression probable viral infection.  Hopefully benign summertime virus discussed.  Need to definitely check for coronavirus however.  Warning signs discussed carefully  Numerous questions  answered.  We will follow-up discussed with patient results once available

## 2019-02-28 NOTE — Telephone Encounter (Signed)
The Patient Engagement Center received a message from [name], LPN from [facility] saying pt was told to be at the COVID-19 testing site by 3:30.    No other information.  I called [name] and she was just letting our dept know he was being tested.    I let her know effective yesterday that the pts no longer need an appt or an order to be tested for COVID-19.    Anyone can go be tested at the sites any time without going through their doctor now.   [name] thanked me for letting her know they no longer needed to send this information to our dept.

## 2019-03-03 ENCOUNTER — Encounter: Payer: Self-pay | Admitting: Family Medicine

## 2019-03-05 ENCOUNTER — Telehealth: Payer: Self-pay | Admitting: *Deleted

## 2019-03-05 LAB — NOVEL CORONAVIRUS, NAA: SARS-CoV-2, NAA: NOT DETECTED

## 2019-03-05 NOTE — Telephone Encounter (Signed)
Pt contacted and verbalized understanding.  

## 2019-03-05 NOTE — Telephone Encounter (Signed)
Inform pt the swab returned negative which of course is good news, so according to current guidelines may return to reg activitiesl however, with occasional "false negatives " occurring, would cont to reduce unnecessary exposures with others for the next several days if at all possible

## 2019-03-05 NOTE — Telephone Encounter (Signed)
Please review covid 19 test results.

## 2019-03-06 ENCOUNTER — Encounter: Payer: Self-pay | Admitting: Family Medicine

## 2019-03-06 ENCOUNTER — Other Ambulatory Visit: Payer: Self-pay

## 2019-03-06 ENCOUNTER — Ambulatory Visit (INDEPENDENT_AMBULATORY_CARE_PROVIDER_SITE_OTHER): Payer: PRIVATE HEALTH INSURANCE | Admitting: Family Medicine

## 2019-03-06 DIAGNOSIS — J029 Acute pharyngitis, unspecified: Secondary | ICD-10-CM | POA: Diagnosis not present

## 2019-03-06 DIAGNOSIS — R6889 Other general symptoms and signs: Secondary | ICD-10-CM | POA: Diagnosis not present

## 2019-03-06 DIAGNOSIS — Z20822 Contact with and (suspected) exposure to covid-19: Secondary | ICD-10-CM

## 2019-03-06 NOTE — Progress Notes (Signed)
   Subjective:    Patient ID: Mark Curry, male    DOB: [birth year], 33 y.o.   MRN: [id number]  HPI This patient started with symptoms last Monday by the middle of the week had sore throat headaches not feeling good some head congestion no wheezing or difficulty breathing when it had COVID testing it came back negative he states the congestion moved into his chest coughing up some discolored phlegm but denies high fever chills sweats denies shortness of breath wheezing or difficulty breathing Patient calls to discuss covid results. Results came back negative but he still having symptoms  Virtual Visit via Video Note  I connected with Mark Curry on 03/06/19 at  8:30 AM EDT by a video enabled telemedicine application and verified that I am speaking with the correct person using two identifiers.  Location: Patient: home Provider: office   I discussed the limitations of evaluation and management by telemedicine and the availability of in person appointments. The patient expressed understanding and agreed to proceed.  History of Present Illness:    Observations/Objective:   Assessment and Plan:   Follow Up Instructions:    I discussed the assessment and treatment plan with the patient. The patient was provided an opportunity to ask questions and all were answered. The patient agreed with the plan and demonstrated an understanding of the instructions.   The patient was advised to call back or seek an in-person evaluation if the symptoms worsen or if the condition fails to improve as anticipated.  I provided 15 minutes of non-face-to-face time during this encounter.      Review of Systems  Constitutional: Negative for activity change, chills and fever.  HENT: Positive for congestion and rhinorrhea. Negative for ear pain.   Eyes: Negative for discharge.  Respiratory: Positive for cough. Negative for wheezing.   Cardiovascular: Negative for chest pain.  Gastrointestinal:  Negative for nausea and vomiting.  Musculoskeletal: Negative for arthralgias.       Objective:   Physical Exam  Patient had virtual visit Appears to be in no distress Atraumatic Neuro able to relate and oriented No apparent resp distress Color normal  Currently right now I do not feel he needs an antibiotic but I did talk with him about what warning signs to watch for     Assessment & Plan:  Viral illness Based on protocols he may return to work this coming Monday It is possible that he did have coronavirus but this test was negative he does not pose any danger to his workplace should he return on Monday certainly if he has ongoing troubles or problems he is to notify us call us if any other issues

## 2019-05-06 ENCOUNTER — Other Ambulatory Visit: Payer: Self-pay

## 2019-05-06 ENCOUNTER — Ambulatory Visit (INDEPENDENT_AMBULATORY_CARE_PROVIDER_SITE_OTHER): Payer: BC Managed Care – PPO | Admitting: Family Medicine

## 2019-05-06 DIAGNOSIS — B349 Viral infection, unspecified: Secondary | ICD-10-CM | POA: Diagnosis not present

## 2019-05-06 NOTE — Progress Notes (Signed)
   Subjective:    Patient ID: Mark Curry, male    DOB: 02-04-1986, 32 y.o.   MRN: [id number] Video visit Sore Throat  This is a new problem. The current episode started yesterday. Associated symptoms include congestion. Associated symptoms comments: Sinus issues. He has tried NSAIDs for the symptoms.   Head congestion sore throat sinus pressure pain discomfort cannot breathe through his nose well denies any wheezing nausea vomiting diarrhea high fever chills body aches.  Denies any rash PMH benign   Review of Systems  HENT: Positive for congestion.    Please see above  Virtual Visit via Video Note  I connected with Mark Curry on 05/06/19 at 10:30 AM EDT by a video enabled telemedicine application and verified that I am speaking with the correct person using two identifiers.  Location: Patient: home Provider: office   I discussed the limitations of evaluation and management by telemedicine and the availability of in person appointments. The patient expressed understanding and agreed to proceed.  History of Present Illness:    Observations/Objective:   Assessment and Plan:   Follow Up Instructions:    I discussed the assessment and treatment plan with the patient. The patient was provided an opportunity to ask questions and all were answered. The patient agreed with the plan and demonstrated an understanding of the instructions.   The patient was advised to call back or seek an in-person evaluation if the symptoms worsen or if the condition fails to improve as anticipated.  I provided 17 minutes of non-face-to-face time during this encounter.        Objective:   Physical Exam Patient had virtual visit Appears to be in no distress Atraumatic Neuro able to relate and oriented No apparent resp distress Color normal        Assessment & Plan:  Viral syndrome Nasal decongestant along with oral decongestants may use over the course the next several days No  need for antibiotic currently call back if worse COVID testing is recommended Patient will go in the morning Warning signs discussed He is to stay at home this week until results of test likelihood COVID is low but cannot rule it out

## 2019-05-07 ENCOUNTER — Other Ambulatory Visit: Payer: Self-pay

## 2019-05-07 DIAGNOSIS — Z20822 Contact with and (suspected) exposure to covid-19: Secondary | ICD-10-CM

## 2019-05-08 ENCOUNTER — Encounter: Payer: Self-pay | Admitting: Family Medicine

## 2019-05-08 LAB — NOVEL CORONAVIRUS, NAA: SARS-CoV-2, NAA: NOT DETECTED

## 2019-05-08 MED ORDER — AMOXICILLIN 500 MG PO CAPS: ORAL_CAPSULE | ORAL | 0 refills | Status: DC

## 2019-05-08 NOTE — Addendum Note (Signed)
Addended by: Vicente Males on: 05/08/2019 11:07 AM   Modules accepted: Orders

## 2019-05-08 NOTE — Telephone Encounter (Signed)
Nurses Please connect with patient  Patient had video visit earlier this week felt to be a viral illness, did COVID testing out of safety, COVID testing negative  Given his symptoms it is quite possible that this is developing into a mild sinus infection with postnasal drainage Amoxicillin 500 mg 1 3 times daily for 10 days is reasonable His COVID testing came back as negative Unlikely he is having COVID I would recommend laying out of work this week in regards to in person By next week he should be able to be back in the swing of things. If any fevers or shortness of breath please let us know

## 2019-06-07 ENCOUNTER — Other Ambulatory Visit: Payer: Self-pay

## 2019-06-08 ENCOUNTER — Other Ambulatory Visit (INDEPENDENT_AMBULATORY_CARE_PROVIDER_SITE_OTHER): Payer: BC Managed Care – PPO

## 2019-06-08 DIAGNOSIS — Z23 Encounter for immunization: Secondary | ICD-10-CM | POA: Diagnosis not present

## 2019-09-18 ENCOUNTER — Encounter: Payer: Self-pay | Admitting: Family Medicine

## 2019-09-19 ENCOUNTER — Ambulatory Visit: Payer: BC Managed Care – PPO | Admitting: Family Medicine

## 2019-09-19 ENCOUNTER — Other Ambulatory Visit (HOSPITAL_COMMUNITY)
Admission: RE | Admit: 2019-09-19 | Discharge: 2019-09-19 | Disposition: A | Discharge status: Home / Self Care | Payer: BC Managed Care – PPO | Source: Ambulatory Visit | Attending: Family Medicine | Admitting: Family Medicine

## 2019-09-19 ENCOUNTER — Encounter: Payer: Self-pay | Admitting: Family Medicine

## 2019-09-19 ENCOUNTER — Other Ambulatory Visit: Payer: Self-pay | Admitting: Family Medicine

## 2019-09-19 ENCOUNTER — Ambulatory Visit (HOSPITAL_COMMUNITY)
Admission: RE | Admit: 2019-09-19 | Discharge: 2019-09-19 | Disposition: A | Discharge status: Home / Self Care | Payer: BC Managed Care – PPO | Source: Ambulatory Visit | Attending: Family Medicine | Admitting: Family Medicine

## 2019-09-19 ENCOUNTER — Other Ambulatory Visit: Payer: Self-pay

## 2019-09-19 VITALS — BP 124/76 | Temp 97.7°F | Wt 275.0 lb

## 2019-09-19 DIAGNOSIS — R748 Abnormal levels of other serum enzymes: Secondary | ICD-10-CM

## 2019-09-19 DIAGNOSIS — R079 Chest pain, unspecified: Secondary | ICD-10-CM | POA: Diagnosis not present

## 2019-09-19 DIAGNOSIS — E785 Hyperlipidemia, unspecified: Secondary | ICD-10-CM

## 2019-09-19 LAB — CBC WITH DIFFERENTIAL/PLATELET
Abs Immature Granulocytes: 0.01 10*3/uL (ref 0.00–0.07)
Basophils Absolute: 0.1 10*3/uL (ref 0.0–0.1)
Basophils Relative: 1 %
Eosinophils Absolute: 0.2 10*3/uL (ref 0.0–0.5)
Eosinophils Relative: 3 %
HCT: 48.2 % (ref 39.0–52.0)
Hemoglobin: 15.8 g/dL (ref 13.0–17.0)
Immature Granulocytes: 0 %
Lymphocytes Relative: 36 %
Lymphs Abs: 2 10*3/uL (ref 0.7–4.0)
MCH: 28.5 pg (ref 26.0–34.0)
MCHC: 32.8 g/dL (ref 30.0–36.0)
MCV: 86.8 fL (ref 80.0–100.0)
Monocytes Absolute: 0.3 10*3/uL (ref 0.1–1.0)
Monocytes Relative: 6 %
Neutro Abs: 3.1 10*3/uL (ref 1.7–7.7)
Neutrophils Relative %: 54 %
Platelets: 200 10*3/uL (ref 150–400)
RBC: 5.55 MIL/uL (ref 4.22–5.81)
RDW: 12.4 % (ref 11.5–15.5)
WBC: 5.6 10*3/uL (ref 4.0–10.5)
nRBC: 0 % (ref 0.0–0.2)

## 2019-09-19 LAB — BASIC METABOLIC PANEL
Anion gap: 12 (ref 5–15)
BUN: 13 mg/dL (ref 6–20)
CO2: 26 mmol/L (ref 22–32)
Calcium: 9.1 mg/dL (ref 8.9–10.3)
Chloride: 101 mmol/L (ref 98–111)
Creatinine, Ser: 0.88 mg/dL (ref 0.61–1.24)
GFR calc Af Amer: >60 mL/min (ref >60)
GFR calc non Af Amer: >60 mL/min (ref >60)
Glucose, Bld: 92 mg/dL (ref 70–99)
Potassium: 3.9 mmol/L (ref 3.5–5.1)
Sodium: 139 mmol/L (ref 135–145)

## 2019-09-19 LAB — TROPONIN I (HIGH SENSITIVITY): Troponin I (High Sensitivity): <2 ng/L (ref <18)

## 2019-09-19 NOTE — Progress Notes (Signed)
   Subjective:    Patient ID: Mark Curry, male    DOB: March 30, 1986, 34 y.o.   MRN: [id number]  Chest Pain  This is a new problem. Episode onset: last night. Quality: sharp pain in center of chest lasted for an hour; The pain does not radiate. Pertinent negatives include no abdominal pain, cough, dizziness, headaches, nausea, shortness of breath or vomiting.  pt has had some family stress recently.  Patient yesterday had some sharp chest pains in the mid sternum area did not radiate.  No shortness of breath with it no tightness or substernal pressure denies sweats chills wheezing difficulty breathing.  Denies swelling in the legs. The patient does try to stay physically active and does do some walking up to 3 miles at a time without chest pain Patient under a lot of stress between his workplace and home situations.  Patient doing the best he can with that but denies being overwhelmed denies being depressed.  Has no history of coronary artery disease but does have some risk factors   Review of Systems  Constitutional: Negative for activity change, appetite change and fatigue.  HENT: Negative for congestion and rhinorrhea.   Respiratory: Negative for cough and shortness of breath.   Cardiovascular: Positive for chest pain. Negative for leg swelling.  Gastrointestinal: Negative for abdominal pain, nausea and vomiting.  Neurological: Negative for dizziness and headaches.  Psychiatric/Behavioral: Negative for agitation and behavioral problems.       Objective:   Physical Exam Neck no masses no adenopathy lungs clear no crackles respiratory rate normal heart regular no murmurs extremities no edema skin warm dry chest wall nontender EKG no acute changes ST segments are normal  Lab work and x-rays ordered Troponin came back negative.  Also chest x-ray came back negative      Assessment & Plan:  Musculoskeletal chest pain Stress is playing a role Recommend stress relaxation  techniques Certainly if progressive symptoms are worse over the course of the next few days notify us and we can always have cardiology consult but the likelihood of coronary artery disease is low. Also should stress become overwhelming over the next 7 to 14 days consideration of medication as well call us back if any ongoing troubles or problems  Also needs routine lipid and liver profile has history of fatty liver.  To do these labs somewhere in the near future

## 2019-09-20 ENCOUNTER — Ambulatory Visit: Payer: BC Managed Care – PPO | Admitting: Family Medicine

## 2019-09-21 LAB — HEPATIC FUNCTION PANEL
ALT: 25 IU/L (ref 0–44)
AST: 22 IU/L (ref 0–40)
Albumin: 4.7 g/dL (ref 4.0–5.0)
Alkaline Phosphatase: 66 IU/L (ref 39–117)
Bilirubin Total: 0.7 mg/dL (ref 0.0–1.2)
Bilirubin, Direct: 0.17 mg/dL (ref 0.00–0.40)
Total Protein: 7.3 g/dL (ref 6.0–8.5)

## 2019-09-21 LAB — LIPID PANEL
Chol/HDL Ratio: 3.4 ratio (ref 0.0–5.0)
Cholesterol, Total: 145 mg/dL (ref 100–199)
HDL: 43 mg/dL (ref >39)
LDL Chol Calc (NIH): 85 mg/dL (ref 0–99)
Triglycerides: 90 mg/dL (ref 0–149)
VLDL Cholesterol Cal: 17 mg/dL (ref 5–40)

## 2019-09-26 ENCOUNTER — Encounter: Payer: Self-pay | Admitting: Family Medicine

## 2019-09-26 DIAGNOSIS — R079 Chest pain, unspecified: Secondary | ICD-10-CM

## 2019-09-27 NOTE — Telephone Encounter (Signed)
Nurses Please connect with Mark Curry Although I am encouraged that he is having less trouble I still believe that we need to be thorough in our approach to this.  Because he does have some risk factors for heart disease it would be advisable to be seen by cardiology More than likely they will talk about doing a stress test to be certain within reason that there is not developing heart disease. In my opinion it is better to be thorough then to cut corners Please go ahead with referral and connect with patient regarding this Thank you-Dr. Lorin Picket

## 2019-09-27 NOTE — Addendum Note (Signed)
Addended by: Marlowe Shores on: 09/27/2019 10:06 AM   Modules accepted: Orders

## 2019-10-01 ENCOUNTER — Encounter: Payer: Self-pay | Admitting: Family Medicine

## 2019-10-01 ENCOUNTER — Ambulatory Visit (INDEPENDENT_AMBULATORY_CARE_PROVIDER_SITE_OTHER): Payer: BC Managed Care – PPO | Admitting: Family Medicine

## 2019-10-01 ENCOUNTER — Other Ambulatory Visit: Payer: Self-pay

## 2019-10-01 ENCOUNTER — Ambulatory Visit: Payer: BC Managed Care – PPO | Attending: Internal Medicine

## 2019-10-01 DIAGNOSIS — Z20822 Contact with and (suspected) exposure to covid-19: Secondary | ICD-10-CM

## 2019-10-01 DIAGNOSIS — J069 Acute upper respiratory infection, unspecified: Secondary | ICD-10-CM

## 2019-10-01 NOTE — Progress Notes (Signed)
   Subjective:    Patient ID: Mark Curry, male    DOB: Oct 17, 1985, 34 y.o.   MRN: [id number]  Cough This is a new problem. The current episode started yesterday. Associated symptoms include headaches, myalgias and nasal congestion. Associated symptoms comments: Sinus pressure.  Patient relates some intermittent congestion coughing sore throat denies high fever chills sweats denies wheezing difficulty breathing  covid test this am  Review of Systems  Respiratory: Positive for cough.   Musculoskeletal: Positive for myalgias.  Neurological: Positive for headaches.  Viral-like symptoms of head congestion feeling bad muscle aches Virtual Visit via Video Note  I connected with Mark Curry on 10/01/19 at  1:10 PM EST by a video enabled telemedicine application and verified that I am speaking with the correct person using two identifiers.  Location: Patient: home Provider: office   I discussed the limitations of evaluation and management by telemedicine and the availability of in person appointments. The patient expressed understanding and agreed to proceed.  History of Present Illness:    Observations/Objective:   Assessment and Plan:   Follow Up Instructions:    I discussed the assessment and treatment plan with the patient. The patient was provided an opportunity to ask questions and all were answered. The patient agreed with the plan and demonstrated an understanding of the instructions.   The patient was advised to call back or seek an in-person evaluation if the symptoms worsen or if the condition fails to improve as anticipated.  I provided 18 minutes of non-face-to-face time during this encounter.       Objective:   Physical Exam Today's visit was via telephone Physical exam was not possible for this visit        Assessment & Plan:  Respiratory illness Possible Covid Vitamin D vitamin C zinc supplements discussed Warning signs discussed Work excuse for  the rest of this week Covid test pending await the results  No antibiotics or x-rays indicated currently

## 2019-10-02 ENCOUNTER — Encounter: Payer: Self-pay | Admitting: Family Medicine

## 2019-10-02 LAB — NOVEL CORONAVIRUS, NAA: SARS-CoV-2, NAA: NOT DETECTED

## 2019-10-07 ENCOUNTER — Encounter: Payer: Self-pay | Admitting: Family Medicine

## 2019-10-07 ENCOUNTER — Other Ambulatory Visit: Payer: Self-pay | Admitting: *Deleted

## 2019-10-07 MED ORDER — AMOXICILLIN-POT CLAVULANATE 875-125 MG PO TABS: 1.0000 | ORAL_TABLET | Freq: Two times a day (BID) | ORAL | 0 refills | Status: DC

## 2019-10-07 NOTE — Telephone Encounter (Signed)
Left message to return call to discuss and see which pharm he wants to use

## 2019-10-07 NOTE — Telephone Encounter (Signed)
Discussed with pt. Pt verbalized understanding and med sent to [facility] [address]

## 2019-10-07 NOTE — Telephone Encounter (Signed)
Nurses Patient was recently seen for viral episode.  More than likely has residual sinusitis currently. It would be fine to go ahead with Augmentin 875 mg 1 twice daily, #20, take with a snack to lessen the chance of upset stomach.  This medication typically does a good job of helping sinusitis.  #1 please send the medication #2 please send notification to the patient regarding this #3 patient to follow-up if ongoing troubles that do not improve over the next 7 to 10 days

## 2019-10-25 ENCOUNTER — Encounter: Payer: Self-pay | Admitting: Cardiology

## 2019-10-25 ENCOUNTER — Other Ambulatory Visit: Payer: Self-pay

## 2019-10-25 ENCOUNTER — Ambulatory Visit (INDEPENDENT_AMBULATORY_CARE_PROVIDER_SITE_OTHER): Payer: BC Managed Care – PPO | Admitting: Cardiology

## 2019-10-25 VITALS — BP 140/80 | HR 70 | Temp 98.5°F | Ht 72.0 in | Wt 281.0 lb

## 2019-10-25 DIAGNOSIS — R0789 Other chest pain: Secondary | ICD-10-CM

## 2019-10-25 NOTE — Patient Instructions (Signed)
Medication Instructions:  Your physician recommends that you continue on your current medications as directed. Please refer to the Current Medication list given to you today.  *If you need a refill on your cardiac medications before your next appointment, please call your pharmacy*   Lab Work: None  If you have labs (blood work) drawn today and your tests are completely normal, you will receive your results only by: Marland Kitchen MyChart Message (if you have MyChart) OR . A paper copy in the mail If you have any lab test that is abnormal or we need to change your treatment, we will call you to review the results.   Testing/Procedures: Your physician has requested that you have an exercise tolerance test. For further information please visit [email or web address] Please also follow instruction sheet, as given.     Follow-Up: At [facility], you and your health needs are our priority.  As part of our continuing mission to provide you with exceptional heart care, we have created designated Provider Care Teams.  These Care Teams include your primary Cardiologist (physician) and Advanced Practice Providers (APPs -  Physician Assistants and Nurse Practitioners) who all work together to provide you with the care you need, when you need it.  We recommend signing up for the patient portal called "MyChart".  Sign up information is provided on this After Visit Summary.  MyChart is used to connect with patients for Virtual Visits (Telemedicine).  Patients are able to view lab/test results, encounter notes, upcoming appointments, etc.  Non-urgent messages can be sent to your provider as well.   To learn more about what you can do with MyChart, go to [email or web address].    Your next appointment:  We will call you with results    Get COVID Test 3 days before treadmill test     Thank you for choosing [facility] !

## 2019-10-25 NOTE — Progress Notes (Signed)
Cardiology Office Note  Date: 10/25/2019   ID: Mark Curry, DOB February 26, 1986, MRN [id number]  PCP:  Kathyrn Drown, MD  Consulting Cardiologist:  Rozann Lesches, MD Electrophysiologist:  None   Chief Complaint  Patient presents with  . History of chest pain    History of Present Illness: Mark Curry is a 34 y.o. male referred for cardiology consultation by Dr. Wolfgang Phoenix for the evaluation of chest pain.  I reviewed the chart including office note by Dr. Wolfgang Phoenix from February 4 when symptoms were discussed.  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  He states that he has had family stressors during the year, thought that this could have been contributing to his symptoms.  He describes the episode as being a focal, lower sternal, sharp discomfort that lasted for about 5 hours after he left work 1 day.  2 days later he experienced brief recurring episodes lasting no more than a minute.  Since then, he has not had any recurrent symptoms.  He does not report any definite reflux or dysphagia.  He has been working on breathing exercises to reduce stress, also walking on a local trail.  Exercise does not bring on symptoms.  He reports no chronic medical conditions and is on no standing prescription medications.  No clear family history of premature CAD.  I reviewed his recent lab work from February as outlined below.  High-sensitivity troponin I level was normal at that time as well.  Past Medical History:  Diagnosis Date  . No pertinent past medical history     Past Surgical History:  Procedure Laterality Date  . TONSILLECTOMY     Medications:   No prescription medications.  Allergies:  Patient has no known allergies.   Social History: The patient  reports that he has never smoked. He has never used smokeless tobacco. He reports previous alcohol use.   Family History: The patient's family history  includes Allergic rhinitis in his brother; Diabetes in his father; Insulin resistance in his sister; Multiple sclerosis in his maternal grandmother; Polycystic ovary syndrome in his mother.   ROS:   Occasional lower back pain.  Also states that his sternum occasionally pops with certain positions - possibly from prior injury resulting from previous weight lifting (this does not cause pain).  Physical Exam: VS:  BP 140/80   Pulse 70   Temp 98.5 F (36.9 C)   Ht 6' (1.829 m)   Wt 281 lb (127.5 kg)   SpO2 95%   BMI 38.11 kg/m , BMI Body mass index is 38.11 kg/m.  Wt Readings from Last 3 Encounters:  10/25/19 281 lb (127.5 kg)  09/19/19 275 lb (124.7 kg)  03/15/18 289 lb 12.8 oz (131.5 kg)    General: Obese male, appears comfortable at rest. HEENT: Conjunctiva and lids normal, wearing a mask. Neck: Supple, no elevated JVP or carotid bruits, no thyromegaly. Lungs: Clear to auscultation, nonlabored breathing at rest. Cardiac: Regular rate and rhythm, no S3 or significant systolic murmur, no pericardial rub. Abdomen: Soft, bowel sounds present. Extremities: No pitting edema, distal pulses 2+. Skin: Warm and dry. Musculoskeletal: No kyphosis. Neuropsychiatric: Alert and oriented x3, affect grossly appropriate.  ECG:  An ECG dated 09/19/2019 was personally reviewed today and demonstrated:  Normal sinus rhythm.  Recent Labwork: 09/19/2019: BUN 13; Creatinine, Ser 0.88; Hemoglobin 15.8; Platelets 200; Potassium 3.9; Sodium 139 09/20/2019: ALT 25; AST 22  Component Value Date/Time   CHOL 145 09/20/2019 0937   TRIG 90 09/20/2019 0937   HDL 43 09/20/2019 0937   CHOLHDL 3.4 09/20/2019 0937   LDLCALC 85 09/20/2019 0937    Other Studies Reviewed Today:  Chest x-ray 09/19/2019: FINDINGS: The cardiopericardial silhouette is within normal limits for size. The lungs are clear without focal pneumonia, edema, pneumothorax or pleural effusion. The visualized bony structures of the thorax  are intact.  IMPRESSION: No active cardiopulmonary disease.  Assessment and Plan:  History of atypical chest pain as outlined above in a 34 year old male with obesity, LDL 85, no clear family history of premature CAD, and nonspecific ECG at baseline.  Family stress may have been a contributor, he does not describe recurring exertional symptoms at this time, no obvious reflux symptoms.  He has not undergone any previous screening evaluation for ischemic heart disease and was referred for discussion of this by Dr. Gerda Diss.  We will plan a GXT.  Medication Adjustments/Labs and Tests Ordered: Current medicines are reviewed at length with the patient today.  Concerns regarding medicines are outlined above.   Tests Ordered: Orders Placed This Encounter  Procedures  . Exercise Tolerance Test    Medication Changes: No orders of the defined types were placed in this encounter.   Disposition:  Follow up test results.  Signed, Jonelle Sidle, MD, [facility] 10/25/2019 2:02 PM    [facility] at [facility] [address] [place], [place] [address] Phone: [phone]; Fax: [phone]

## 2019-10-29 ENCOUNTER — Other Ambulatory Visit (HOSPITAL_COMMUNITY)
Admission: RE | Admit: 2019-10-29 | Discharge: 2019-10-29 | Disposition: A | Discharge status: Home / Self Care | Payer: BC Managed Care – PPO | Source: Ambulatory Visit | Attending: Family Medicine | Admitting: Family Medicine

## 2019-10-29 ENCOUNTER — Other Ambulatory Visit: Payer: Self-pay

## 2019-10-30 ENCOUNTER — Other Ambulatory Visit: Payer: Self-pay

## 2019-10-30 ENCOUNTER — Inpatient Hospital Stay (HOSPITAL_COMMUNITY): Admission: RE | Admit: 2019-10-30 | Payer: BC Managed Care – PPO | Source: Ambulatory Visit

## 2019-10-30 ENCOUNTER — Ambulatory Visit: Payer: BC Managed Care – PPO

## 2019-10-30 ENCOUNTER — Other Ambulatory Visit
Admission: RE | Admit: 2019-10-30 | Discharge: 2019-10-30 | Disposition: A | Discharge status: Home / Self Care | Payer: BC Managed Care – PPO | Source: Ambulatory Visit | Attending: Cardiology | Admitting: Cardiology

## 2019-10-30 DIAGNOSIS — Z20822 Contact with and (suspected) exposure to covid-19: Secondary | ICD-10-CM | POA: Diagnosis not present

## 2019-10-30 DIAGNOSIS — Z01812 Encounter for preprocedural laboratory examination: Secondary | ICD-10-CM | POA: Insufficient documentation

## 2019-10-30 LAB — SARS CORONAVIRUS 2 (TAT 6-24 HRS): SARS Coronavirus 2: NEGATIVE

## 2019-11-01 ENCOUNTER — Other Ambulatory Visit: Payer: Self-pay

## 2019-11-01 ENCOUNTER — Encounter (HOSPITAL_COMMUNITY): Payer: BC Managed Care – PPO

## 2019-11-01 ENCOUNTER — Ambulatory Visit (HOSPITAL_COMMUNITY)
Admission: RE | Admit: 2019-11-01 | Discharge: 2019-11-01 | Disposition: A | Discharge status: Home / Self Care | Payer: BC Managed Care – PPO | Source: Ambulatory Visit | Attending: Family Medicine | Admitting: Family Medicine

## 2019-11-01 DIAGNOSIS — R0789 Other chest pain: Secondary | ICD-10-CM

## 2019-11-01 LAB — EXERCISE TOLERANCE TEST
Estimated workload: 11.7 METS
Exercise duration (min): 9 min
Exercise duration (sec): 58 s
MPHR: 188 {beats}/min
Peak HR: 169 {beats}/min
Percent HR: 89 %
RPE: 13
Rest HR: 78 {beats}/min

## 2020-01-07 ENCOUNTER — Other Ambulatory Visit: Payer: Self-pay

## 2020-01-07 ENCOUNTER — Encounter: Payer: Self-pay | Admitting: Family Medicine

## 2020-01-07 ENCOUNTER — Ambulatory Visit (INDEPENDENT_AMBULATORY_CARE_PROVIDER_SITE_OTHER): Payer: BC Managed Care – PPO | Admitting: Family Medicine

## 2020-01-07 VITALS — BP 116/72 | Temp 97.1°F | Wt 284.2 lb

## 2020-01-07 DIAGNOSIS — H60331 Swimmer's ear, right ear: Secondary | ICD-10-CM | POA: Diagnosis not present

## 2020-01-07 MED ORDER — NEOMYCIN-POLYMYXIN-HC 3.5-10000-1 OT SOLN: 3.0000 [drp] | Freq: Four times a day (QID) | OTIC | 0 refills | Status: DC

## 2020-01-07 NOTE — Progress Notes (Signed)
   Subjective:    Patient ID: Mark Curry, male    DOB: March 20, 1986, 34 y.o.   MRN: [id number]  HPI Patient comes in today with complaints of right eat pain x 3 days.  Patient states its sensitive but also has a clogged sensation at times.   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 Blood pressure good on repeat. HEENT normal. Lungs clear. Heart regular rate and rhythm. Right external canal slight inflammation.  Preauricular region no current tenderness      Assessment & Plan:  Impression mild external otitis.  No wax.  Cortisporin otic suspension prescribed symptom care discussed

## 2020-01-18 ENCOUNTER — Encounter (HOSPITAL_COMMUNITY): Payer: Self-pay | Admitting: Emergency Medicine

## 2020-01-18 ENCOUNTER — Emergency Department (HOSPITAL_COMMUNITY): Payer: BC Managed Care – PPO

## 2020-01-18 ENCOUNTER — Emergency Department (HOSPITAL_COMMUNITY)
Admission: EM | Admit: 2020-01-18 | Discharge: 2020-01-19 | Disposition: A | Discharge status: Home / Self Care | Payer: BC Managed Care – PPO | Attending: Emergency Medicine | Admitting: Emergency Medicine

## 2020-01-18 ENCOUNTER — Other Ambulatory Visit: Payer: Self-pay

## 2020-01-18 DIAGNOSIS — Z20822 Contact with and (suspected) exposure to covid-19: Secondary | ICD-10-CM | POA: Diagnosis not present

## 2020-01-18 DIAGNOSIS — R112 Nausea with vomiting, unspecified: Secondary | ICD-10-CM | POA: Diagnosis not present

## 2020-01-18 DIAGNOSIS — R1084 Generalized abdominal pain: Secondary | ICD-10-CM | POA: Diagnosis present

## 2020-01-18 DIAGNOSIS — R197 Diarrhea, unspecified: Secondary | ICD-10-CM | POA: Insufficient documentation

## 2020-01-18 LAB — COMPREHENSIVE METABOLIC PANEL
ALT: 42 U/L (ref 0–44)
AST: 31 U/L (ref 15–41)
Albumin: 4.7 g/dL (ref 3.5–5.0)
Alkaline Phosphatase: 63 U/L (ref 38–126)
Anion gap: 10 (ref 5–15)
BUN: 13 mg/dL (ref 6–20)
CO2: 24 mmol/L (ref 22–32)
Calcium: 8.9 mg/dL (ref 8.9–10.3)
Chloride: 104 mmol/L (ref 98–111)
Creatinine, Ser: 0.91 mg/dL (ref 0.61–1.24)
GFR calc Af Amer: >60 mL/min (ref >60)
GFR calc non Af Amer: >60 mL/min (ref >60)
Glucose, Bld: 120 mg/dL — ABNORMAL HIGH (ref 70–99)
Potassium: 3.7 mmol/L (ref 3.5–5.1)
Sodium: 138 mmol/L (ref 135–145)
Total Bilirubin: 1.2 mg/dL (ref 0.3–1.2)
Total Protein: 8 g/dL (ref 6.5–8.1)

## 2020-01-18 LAB — CBC WITH DIFFERENTIAL/PLATELET
Abs Immature Granulocytes: 0.02 10*3/uL (ref 0.00–0.07)
Basophils Absolute: 0 10*3/uL (ref 0.0–0.1)
Basophils Relative: 0 %
Eosinophils Absolute: 0.2 10*3/uL (ref 0.0–0.5)
Eosinophils Relative: 3 %
HCT: 51 % (ref 39.0–52.0)
Hemoglobin: 17 g/dL (ref 13.0–17.0)
Immature Granulocytes: 0 %
Lymphocytes Relative: 5 %
Lymphs Abs: 0.4 10*3/uL — ABNORMAL LOW (ref 0.7–4.0)
MCH: 28.7 pg (ref 26.0–34.0)
MCHC: 33.3 g/dL (ref 30.0–36.0)
MCV: 86 fL (ref 80.0–100.0)
Monocytes Absolute: 0.3 10*3/uL (ref 0.1–1.0)
Monocytes Relative: 4 %
Neutro Abs: 7.4 10*3/uL (ref 1.7–7.7)
Neutrophils Relative %: 88 %
Platelets: 197 10*3/uL (ref 150–400)
RBC: 5.93 MIL/uL — ABNORMAL HIGH (ref 4.22–5.81)
RDW: 12.8 % (ref 11.5–15.5)
WBC: 8.4 10*3/uL (ref 4.0–10.5)
nRBC: 0 % (ref 0.0–0.2)

## 2020-01-18 LAB — URINALYSIS, ROUTINE W REFLEX MICROSCOPIC
Bilirubin Urine: NEGATIVE
Glucose, UA: NEGATIVE mg/dL
Hgb urine dipstick: NEGATIVE
Ketones, ur: NEGATIVE mg/dL
Leukocytes,Ua: NEGATIVE
Nitrite: NEGATIVE
Protein, ur: NEGATIVE mg/dL
Specific Gravity, Urine: 1.021 (ref 1.005–1.030)
pH: 6 (ref 5.0–8.0)

## 2020-01-18 LAB — SARS CORONAVIRUS 2 BY RT PCR (HOSPITAL ORDER, PERFORMED IN ~~LOC~~ HOSPITAL LAB): SARS Coronavirus 2: NEGATIVE

## 2020-01-18 LAB — LIPASE, BLOOD: Lipase: 21 U/L (ref 11–51)

## 2020-01-18 MED ORDER — SODIUM CHLORIDE 0.9 % IV BOLUS
1000.0000 mL | Freq: Once | INTRAVENOUS | Status: AC
  Administered 2020-01-18: 1000 mL via INTRAVENOUS

## 2020-01-18 MED ORDER — MORPHINE SULFATE (PF) 4 MG/ML IV SOLN
4.0000 mg | Freq: Once | INTRAVENOUS | Status: AC
  Administered 2020-01-18: 4 mg via INTRAVENOUS
  Filled 2020-01-18: qty 1

## 2020-01-18 MED ORDER — ONDANSETRON 4 MG PO TBDP: 4.0000 mg | ORAL_TABLET | Freq: Three times a day (TID) | ORAL | 0 refills | Status: DC | PRN

## 2020-01-18 MED ORDER — ONDANSETRON HCL 4 MG/2ML IJ SOLN
4.0000 mg | Freq: Once | INTRAMUSCULAR | Status: AC
  Administered 2020-01-18: 4 mg via INTRAVENOUS
  Filled 2020-01-18: qty 2

## 2020-01-18 MED ORDER — PROMETHAZINE HCL 25 MG/ML IJ SOLN
25.0000 mg | Freq: Once | INTRAMUSCULAR | Status: AC
  Administered 2020-01-18: 25 mg via INTRAVENOUS
  Filled 2020-01-18: qty 1

## 2020-01-18 MED ORDER — ONDANSETRON HCL 4 MG PO TABS: 4.0000 mg | ORAL_TABLET | Freq: Three times a day (TID) | ORAL | 0 refills | Status: DC | PRN

## 2020-01-18 MED ORDER — ONDANSETRON 4 MG PO TBDP
4.0000 mg | ORAL_TABLET | Freq: Once | ORAL | Status: AC | PRN
  Administered 2020-01-18: 4 mg via ORAL
  Filled 2020-01-18: qty 1

## 2020-01-18 NOTE — ED Provider Notes (Signed)
Since the [facility] EMERGENCY DEPARTMENT Provider Note   CSN: [id number] Arrival date & time: 01/18/20  1612     History Chief Complaint  Patient presents with   Abdominal Pain    Mark Curry is a 34 y.o. male with a past medical history significant for fatty liver who presents to the ED due to generalized abdominal pain associated with nausea, vomiting, and diarrhea that started yesterday. Patient admits to 3 episodes of nonbloody diarrhea earlier this morning and one episode of nonbloody, nonbilious emesis today.  He admits to eating [employer] for the first time in awhile yesterday around 2 PM and is concerned about possible food poisoning.  He has tried Aleve with no relief.  Denies previous abdominal operations.  Denies fever and chills.  Patient notes his daughter was sick on Wednesday and Thursday of last week with similar symptoms.  Denies known Covid exposures.  He has received both his Covid vaccines.  Denies urinary symptoms. No recent antibiotics.   History obtained from patient and past medical records. No interpreter used during encounter.      Past Medical History:  Diagnosis Date   No pertinent past medical history     Patient Active Problem List   Diagnosis Date Noted   Fatty liver 03/15/2018   Morbid obesity (HCC) 03/15/2018   Elevated liver enzymes 03/15/2018   Perennial and seasonal allergic rhinitis 11/06/2017   Anosmia 11/06/2017    Past Surgical History:  Procedure Laterality Date   TONSILLECTOMY         Family History  Problem Relation Age of Onset   Polycystic ovary syndrome Mother    Diabetes Father    Insulin resistance Sister    Multiple sclerosis Maternal Grandmother    Allergic rhinitis Brother    Angioedema Neg Hx    Asthma Neg Hx    Eczema Neg Hx    Immunodeficiency Neg Hx    Urticaria Neg Hx     Social History   Tobacco Use   Smoking status: Never Smoker   Smokeless tobacco: Never Used  Substance Use  Topics   Alcohol use: Not Currently   Drug use: Never    Home Medications Prior to Admission medications   Medication Sig Start Date End Date Taking? Authorizing Provider  neomycin-polymyxin-hydrocortisone (CORTISPORIN) OTIC solution Place 3 drops into the right ear 4 (four) times daily. 01/07/20   Merlyn Albert, MD  ondansetron (ZOFRAN ODT) 4 MG disintegrating tablet Take 1 tablet (4 mg total) by mouth every 8 (eight) hours as needed for nausea or vomiting. 01/18/20   Mannie Stabile, PA-C    Allergies    Patient has no known allergies.  Review of Systems   Review of Systems  Constitutional: Negative for chills and fever.  Gastrointestinal: Positive for abdominal pain, diarrhea, nausea and vomiting.  Genitourinary: Negative for dysuria.  All other systems reviewed and are negative.   Physical Exam Updated Vital Signs BP 129/82 (BP Location: Right Arm)    Pulse (!) 106    Temp 100 F (37.8 C) (Oral)    Resp 18    Ht 6' (1.829 m)    Wt 127 kg    SpO2 98%    BMI 37.97 kg/m   Physical Exam Vitals and nursing note reviewed.  Constitutional:      General: He is not in acute distress.    Appearance: He is not ill-appearing.  HENT:     Head: Normocephalic.  Eyes:  Pupils: Pupils are equal, round, and reactive to light.  Cardiovascular:     Rate and Rhythm: Normal rate and regular rhythm.     Pulses: Normal pulses.     Heart sounds: Normal heart sounds. No murmur. No friction rub. No gallop.   Pulmonary:     Effort: Pulmonary effort is normal.     Breath sounds: Normal breath sounds.  Abdominal:     General: Abdomen is flat. Bowel sounds are normal. There is no distension.     Palpations: Abdomen is soft.     Tenderness: There is no abdominal tenderness. There is no right CVA tenderness, left CVA tenderness or guarding.     Comments: Abdomen soft, nondistended, with generalized abdominal tenderness.  No rebound or guarding.  No peritoneal signs.  Musculoskeletal:       Cervical back: Neck supple.     Comments: Able to move all 4 extremities without difficulty.   Skin:    General: Skin is warm and dry.  Neurological:     General: No focal deficit present.     Mental Status: He is alert.  Psychiatric:        Mood and Affect: Mood normal.        Behavior: Behavior normal.     ED Results / Procedures / Treatments   Labs (all labs ordered are listed, but only abnormal results are displayed) Labs Reviewed  CBC WITH DIFFERENTIAL/PLATELET - Abnormal; Notable for the following components:      Result Value   RBC 5.93 (*)    Lymphs Abs 0.4 (*)    All other components within normal limits  COMPREHENSIVE METABOLIC PANEL - Abnormal; Notable for the following components:   Glucose, Bld 120 (*)    All other components within normal limits  SARS CORONAVIRUS 2 BY RT PCR (HOSPITAL ORDER, [place] [facility])  LIPASE, BLOOD  URINALYSIS, ROUTINE W REFLEX MICROSCOPIC    EKG None  Radiology No results found.  Procedures Procedures (including critical care time)  Medications Ordered in ED Medications  morphine 4 MG/ML injection 4 mg (has no administration in time range)  ondansetron (ZOFRAN-ODT) disintegrating tablet 4 mg (4 mg Oral Given 01/18/20 1641)  sodium chloride 0.9 % bolus 1,000 mL (1,000 mLs Intravenous New Bag/Given 01/18/20 1746)  promethazine (PHENERGAN) injection 25 mg (25 mg Intravenous Given 01/18/20 1834)    ED Course  I have reviewed the triage vital signs and the nursing notes.  Pertinent labs & imaging results that were available during my care of the patient were reviewed by me and considered in my medical decision making (see chart for details).  Clinical Course as of Jan 18 1848  Sat Jan 18, 2020  1849 SARS Coronavirus 2: NEGATIVE [CA]    Clinical Course User Index [CA] Suzy Bouchard, PA-C   MDM Rules/Calculators/A&P                     34 year old male presents to the ED due to generalized  abdominal pain associated with nausea, vomiting, diarrhea that started yesterday.  Daughter sick with similar symptoms on Wednesday and Thursday.  Patient admits to eating [employer] yesterday prior to symptom onset.  No fever or chills.  Denies known Covid exposures.  Upon arrival, stable vitals.  Triage noted patient to be tachycardic at 115 however during my initial evaluation heart rate in the 90s.  Patient in no acute distress and nontoxic-appearing.  Abdomen soft, nondistended with generalized abdominal  tenderness.  No peritoneal signs.  No CVA tenderness bilaterally.  Doubt acute abdomen.  Will obtain routine labs to rule out signs of infection or electrolyte abnormalities.  IV fluids and Zofran given here in the ED for symptomatic relief. Suspect symptoms related to gastroenteritis vs. Food poisoning. Will obtain COVID test to rule out COVID infection.   CBC reassuring with no leukocytosis.  UA negative for signs of infection or hematuria.  CMP reassuring with normal renal function no major electrolyte derangements.  Mild hyperglycemia at 120 with no anion gap.  Doubt DKA.  Lipase normal at 21.  Doubt pancreatitis.   Patient handed off to Burgess Amor, PA-C at shift change pending symptomatic relief and po challenge. If patient able to tolerate po, patient may be discharged home with PCP follow-up.  Final Clinical Impression(s) / ED Diagnoses Final diagnoses:  Generalized abdominal pain  Nausea vomiting and diarrhea    Rx / DC Orders ED Discharge Orders         Ordered    ondansetron (ZOFRAN ODT) 4 MG disintegrating tablet  Every 8 hours PRN     01/18/20 1845           Jesusita Oka 01/18/20 1854    Bethann Berkshire, MD 01/19/20 1629

## 2020-01-18 NOTE — ED Triage Notes (Signed)
PT c/o n/v/d and generalized body aches that started last night. PT states he has had 3 diarrhea stools and one episode of vomiting and continues to have generalized abdominal pain.

## 2020-01-18 NOTE — ED Provider Notes (Addendum)
Pt signed out to me from Mardella Layman, PA-C pending PO challenge with suspected gastroenteritis.  Sx include 2 day history of nausea, vomiting and diarrhea with generalized abd pain and bloating.  Labs reviewed and stable, normal wbc count, no evidence of dehydration or electrolyte abnormalities.  He was given zofran and morphine, pain improved, nausea returned with continued emesis after PO challenge, no diarrhea while here.  He was very drowsy after receiving phenergan and morphine.  Additional IV fluids ordered.  Acute abd series ordered to assess for normal bowel gas pattern.  At re-exam, pt's abd exam nontender, no focal pain, no guard or rebound.  Soft distention, normal BS.   Acute abd series completed, no sign of obstruction. Pt tolerated PO fluids.  Wife now at bedside and reports patient has had change in bowel habits for at least the past year, reporting he has an urgent diarrhea after most meals.  He has not discussed this with his PCP, no known family history of inflammatory bowel disease.  No known food allergies.  He was given Zofran for home use, advised increase fluid intake, rest.  Strict return precautions were given including localizing abdominal pain, persistent vomiting or diarrhea.  Results for orders placed or performed during the hospital encounter of 01/18/20  SARS Coronavirus 2 by RT PCR (hospital order, performed in [place] hospital lab) Nasopharyngeal Nasopharyngeal Swab   Specimen: Nasopharyngeal Swab  Result Value Ref Range   SARS Coronavirus 2 NEGATIVE NEGATIVE  CBC with Differential  Result Value Ref Range   WBC 8.4 4.0 - 10.5 K/uL   RBC 5.93 (H) 4.22 - 5.81 MIL/uL   Hemoglobin 17.0 13.0 - 17.0 g/dL   HCT 51.0 39.0 - 52.0 %   MCV 86.0 80.0 - 100.0 fL   MCH 28.7 26.0 - 34.0 pg   MCHC 33.3 30.0 - 36.0 g/dL   RDW 12.8 11.5 - 15.5 %   Platelets 197 150 - 400 K/uL   nRBC 0.0 0.0 - 0.2 %   Neutrophils Relative % 88 %   Neutro Abs 7.4 1.7 - 7.7 K/uL   Lymphocytes Relative 5 %   Lymphs Abs 0.4 (L) 0.7 - 4.0 K/uL   Monocytes Relative 4 %   Monocytes Absolute 0.3 0.1 - 1.0 K/uL   Eosinophils Relative 3 %   Eosinophils Absolute 0.2 0.0 - 0.5 K/uL   Basophils Relative 0 %   Basophils Absolute 0.0 0.0 - 0.1 K/uL   Immature Granulocytes 0 %   Abs Immature Granulocytes 0.02 0.00 - 0.07 K/uL  Comprehensive metabolic panel  Result Value Ref Range   Sodium 138 135 - 145 mmol/L   Potassium 3.7 3.5 - 5.1 mmol/L   Chloride 104 98 - 111 mmol/L   CO2 24 22 - 32 mmol/L   Glucose, Bld 120 (H) 70 - 99 mg/dL   BUN 13 6 - 20 mg/dL   Creatinine, Ser 0.91 0.61 - 1.24 mg/dL   Calcium 8.9 8.9 - 10.3 mg/dL   Total Protein 8.0 6.5 - 8.1 g/dL   Albumin 4.7 3.5 - 5.0 g/dL   AST 31 15 - 41 U/L   ALT 42 0 - 44 U/L   Alkaline Phosphatase 63 38 - 126 U/L   Total Bilirubin 1.2 0.3 - 1.2 mg/dL   GFR calc non Af Amer >60 >60 mL/min   GFR calc Af Amer >60 >60 mL/min   Anion gap 10 5 - 15  Lipase, blood  Result Value Ref Range  Lipase 21 11 - 51 U/L  Urinalysis, Routine w reflex microscopic  Result Value Ref Range   Color, Urine YELLOW YELLOW   APPearance CLEAR CLEAR   Specific Gravity, Urine 1.021 1.005 - 1.030   pH 6.0 5.0 - 8.0   Glucose, UA NEGATIVE NEGATIVE mg/dL   Hgb urine dipstick NEGATIVE NEGATIVE   Bilirubin Urine NEGATIVE NEGATIVE   Ketones, ur NEGATIVE NEGATIVE mg/dL   Protein, ur NEGATIVE NEGATIVE mg/dL   Nitrite NEGATIVE NEGATIVE   Leukocytes,Ua NEGATIVE NEGATIVE   DG ABD ACUTE 2+V W 1V CHEST  Result Date: 01/18/2020 CLINICAL DATA:  Nausea, vomiting, diarrhea, generalized abdominal pain EXAM: DG ABDOMEN ACUTE W/ 1V CHEST COMPARISON:  09/19/2019 FINDINGS: Supine and upright frontal views of the abdomen as well as an upright frontal view of the chest are obtained. Cardiac silhouette is unremarkable. No airspace disease, effusion, or pneumothorax. Bowel gas pattern is unremarkable without obstruction or ileus. No masses or abnormal  calcifications. No free gas in the greater peritoneal sac. There are no acute bony abnormalities. IMPRESSION: 1. Unremarkable abdominal series. Electronically Signed   By: Sharlet Salina M.D.   On: 01/18/2020 22:37      Victoriano Lain 01/18/20 2314    Burgess Amor, PA-C 01/19/20 1055    Bethann Berkshire, MD 01/19/20 [id number]

## 2020-01-18 NOTE — Discharge Instructions (Addendum)
As discussed, all of your labs were reassuring today.  I suspect your symptoms are related to gastroenteritis or possible food poisoning.  Please continue to drink fluids.  I am sending home with nausea medication.  You may take over-the-counter Tylenol as needed for abdominal pain.  Please follow-up with PCP if symptoms do not improve within the next week.  Return to the ER for new or worsening symptoms including worsening nausea or vomiting, abdominal pain or fevers.

## 2020-01-20 ENCOUNTER — Telehealth: Payer: Self-pay | Admitting: Family Medicine

## 2020-01-20 NOTE — Telephone Encounter (Signed)
Contacted patient. Pt states he is feeling better as long as he takes Zofran. Once the Zofran wears off, he begins to be nauseous again. Pt states he would like to have an appointment for tomorrow. Pt transferred up front to schedule appt.

## 2020-01-20 NOTE — Telephone Encounter (Signed)
Pt went to ED for abdominal pain, N/V/D. Please advise. Thank you

## 2020-01-20 NOTE — Telephone Encounter (Signed)
Nurse Please talk with patient If patient feels he needs to be seen today we can work and at 4:10 PM otherwise may have same-day slot for tomorrow This should be guided based on the patient how he is feeling thank you

## 2020-01-20 NOTE — Telephone Encounter (Signed)
Patient is requesting ER follow up  Please advise where to add to schedule all that's left is same day appointment for the next 3 weeks.

## 2020-01-21 ENCOUNTER — Telehealth (INDEPENDENT_AMBULATORY_CARE_PROVIDER_SITE_OTHER): Payer: BC Managed Care – PPO | Admitting: Family Medicine

## 2020-01-21 ENCOUNTER — Telehealth: Payer: Self-pay | Admitting: *Deleted

## 2020-01-21 ENCOUNTER — Other Ambulatory Visit: Payer: Self-pay

## 2020-01-21 DIAGNOSIS — A084 Viral intestinal infection, unspecified: Secondary | ICD-10-CM | POA: Diagnosis not present

## 2020-01-21 NOTE — Progress Notes (Signed)
   Subjective:    Patient ID: Mark Curry, male    DOB: [birth year], 34 y.o.   MRN: [id number]  HPI Patient calls in today for follow up on ER visit for diarrhea and vomiting. Patient last vomited Saturday after leaving the hospital but has been using Zofran since. Still having issues with the diarrhea, but feeling slightly better.  Patient denies mucus denies blood in the stools.  States appetite start not improved Reports unusual bowel patterns for the last year, diarrhea after meals.   Virtual Visit via Video Note  I connected with Mark Curry on 01/21/20 at  1:10 PM EDT by a video enabled telemedicine application and verified that I am speaking with the correct person using two identifiers.  Location: Patient: home Provider: office   I discussed the limitations of evaluation and management by telemedicine and the availability of in person appointments. The patient expressed understanding and agreed to proceed.  History of Present Illness:    Observations/Objective:   Assessment and Plan:   Follow Up Instructions:    I discussed the assessment and treatment plan with the patient. The patient was provided an opportunity to ask questions and all were answered. The patient agreed with the plan and demonstrated an understanding of the instructions.   The patient was advised to call back or seek an in-person evaluation if the symptoms worsen or if the condition fails to improve as anticipated.  I provided 12 minutes of non-face-to-face time during this encounter.  And documenting  Review of Systems     Objective:   Physical Exam  Today's visit was via telephone Physical exam was not possible for this visit       Assessment & Plan:  Viral gastroenteritis gradually getting better no interventions necessary currently If he has ongoing trouble with bowels he will let us know we will set up with GI Patient denies rectal bleeding

## 2020-01-21 NOTE — Telephone Encounter (Signed)
[name], [facility] are scheduled for a virtual visit with your provider today.    Just as we do with appointments in the office, we must obtain your consent to participate.  Your consent will be active for this visit and any virtual visit you may have with one of our providers in the next 365 days.    If you have a MyChart account, I can also send a copy of this consent to you electronically.  All virtual visits are billed to your insurance company just like a traditional visit in the office.  As this is a virtual visit, video technology does not allow for your provider to perform a traditional examination.  This may limit your provider's ability to fully assess your condition.  If your provider identifies any concerns that need to be evaluated in person or the need to arrange testing such as labs, EKG, etc, we will make arrangements to do so.    Although advances in technology are sophisticated, we cannot ensure that it will always work on either your end or our end.  If the connection with a video visit is poor, we may have to switch to a telephone visit.  With either a video or telephone visit, we are not always able to ensure that we have a secure connection.   I need to obtain your verbal consent now.   Are you willing to proceed with your visit today?   Mark Curry has provided verbal consent on 01/21/2020 for a virtual visit (video or telephone).   Haze Rushing, LPN 09/19/971  53:29 AM

## 2020-01-30 ENCOUNTER — Encounter: Payer: Self-pay | Admitting: Family Medicine

## 2020-01-30 NOTE — Telephone Encounter (Signed)
Nurses  We will need to see Liz Beach in person 11:30 please on Friday

## 2020-01-31 ENCOUNTER — Other Ambulatory Visit: Payer: Self-pay

## 2020-01-31 ENCOUNTER — Ambulatory Visit: Payer: BC Managed Care – PPO | Admitting: Family Medicine

## 2020-01-31 VITALS — Temp 97.7°F | Ht 72.0 in | Wt 281.2 lb

## 2020-01-31 DIAGNOSIS — R233 Spontaneous ecchymoses: Secondary | ICD-10-CM

## 2020-01-31 NOTE — Progress Notes (Signed)
   Subjective:    Patient ID: Mark Curry, male    DOB: 01-07-1986, 34 y.o.   MRN: [id number]  HPI  Patient arrives with spots on his shoulders since yesterday. Patient states he started noticing these yesterday and there appeared to be more today denies any bleeding problem denies any fever chills sweats states appetite is doing good denies bloody stools or hematuria.  Has never had this problem before.  Energy level good.  Had recent GI illness approximately 2 weeks ago.  Denies any abdominal pain currently. Review of Systems Please see above    Objective:   Physical Exam Lungs were clear heart regular extremities no edema skin warm dry patient not toxic Small area of petechiae noted bilateral anterior shoulder None noted anywhere else       Assessment & Plan:  Petechiae Will do CBC PT PTT Await the results.  Patient was informed that if the CBC shows a significant issue may need to do further evaluation or intervention  If high fevers sweats weight loss or multiple other petechiae occur or significant illness patient is to notify us.  Addendum-[facility] notified us at 4:30 AM platelets critically low.  It is possible this could be ITP.  Also possible could be related to autoimmune from recent GI infection 2 weeks ago.  Patient was reached via phone.  He is doing fine.  He will go to [facility] ER for further evaluation. [facility] triage was spoken with.

## 2020-02-01 ENCOUNTER — Inpatient Hospital Stay (HOSPITAL_COMMUNITY)
Admission: EM | Admit: 2020-02-01 | Discharge: 2020-02-02 | DRG: 813 | Disposition: A | Discharge status: Home / Self Care | Payer: BC Managed Care – PPO | Attending: Internal Medicine | Admitting: Internal Medicine

## 2020-02-01 ENCOUNTER — Other Ambulatory Visit: Payer: Self-pay | Admitting: Oncology

## 2020-02-01 ENCOUNTER — Encounter (HOSPITAL_COMMUNITY): Payer: Self-pay | Admitting: Emergency Medicine

## 2020-02-01 DIAGNOSIS — Z20822 Contact with and (suspected) exposure to covid-19: Secondary | ICD-10-CM | POA: Diagnosis present

## 2020-02-01 DIAGNOSIS — K760 Fatty (change of) liver, not elsewhere classified: Secondary | ICD-10-CM

## 2020-02-01 DIAGNOSIS — D696 Thrombocytopenia, unspecified: Secondary | ICD-10-CM

## 2020-02-01 DIAGNOSIS — R748 Abnormal levels of other serum enzymes: Secondary | ICD-10-CM | POA: Diagnosis not present

## 2020-02-01 DIAGNOSIS — R252 Cramp and spasm: Secondary | ICD-10-CM | POA: Diagnosis not present

## 2020-02-01 DIAGNOSIS — T380X5A Adverse effect of glucocorticoids and synthetic analogues, initial encounter: Secondary | ICD-10-CM | POA: Diagnosis not present

## 2020-02-01 DIAGNOSIS — R21 Rash and other nonspecific skin eruption: Secondary | ICD-10-CM | POA: Diagnosis present

## 2020-02-01 DIAGNOSIS — R430 Anosmia: Secondary | ICD-10-CM

## 2020-02-01 DIAGNOSIS — G4709 Other insomnia: Secondary | ICD-10-CM | POA: Diagnosis not present

## 2020-02-01 DIAGNOSIS — Z6838 Body mass index (BMI) 38.0-38.9, adult: Secondary | ICD-10-CM | POA: Diagnosis not present

## 2020-02-01 DIAGNOSIS — B999 Unspecified infectious disease: Secondary | ICD-10-CM | POA: Diagnosis present

## 2020-02-01 DIAGNOSIS — Z833 Family history of diabetes mellitus: Secondary | ICD-10-CM

## 2020-02-01 DIAGNOSIS — Z820 Family history of epilepsy and other diseases of the nervous system: Secondary | ICD-10-CM | POA: Diagnosis not present

## 2020-02-01 DIAGNOSIS — J3089 Other allergic rhinitis: Secondary | ICD-10-CM

## 2020-02-01 DIAGNOSIS — Y92239 Unspecified place in hospital as the place of occurrence of the external cause: Secondary | ICD-10-CM | POA: Diagnosis not present

## 2020-02-01 DIAGNOSIS — D693 Immune thrombocytopenic purpura: Secondary | ICD-10-CM | POA: Diagnosis present

## 2020-02-01 DIAGNOSIS — D6959 Other secondary thrombocytopenia: Secondary | ICD-10-CM | POA: Diagnosis not present

## 2020-02-01 LAB — CBC WITH DIFFERENTIAL/PLATELET
Abs Immature Granulocytes: 0.03 10*3/uL (ref 0.00–0.07)
Basophils Absolute: 0.1 10*3/uL (ref 0.0–0.2)
Basophils Absolute: 0.1 10*3/uL (ref 0.0–0.1)
Basophils Relative: 1 %
Basos: 1 %
EOS (ABSOLUTE): 0.3 10*3/uL (ref 0.0–0.4)
Eos: 4 %
Eosinophils Absolute: 0.3 10*3/uL (ref 0.0–0.5)
Eosinophils Relative: 3 %
HCT: 49.9 % (ref 39.0–52.0)
Hematocrit: 51.2 % — ABNORMAL HIGH (ref 37.5–51.0)
Hemoglobin: 17.1 g/dL (ref 13.0–17.7)
Hemoglobin: 16.4 g/dL (ref 13.0–17.0)
Immature Grans (Abs): 0 10*3/uL (ref 0.0–0.1)
Immature Granulocytes: 1 %
Immature Granulocytes: 0 %
Lymphocytes Absolute: 2.2 10*3/uL (ref 0.7–3.1)
Lymphocytes Relative: 30 %
Lymphs Abs: 2.4 10*3/uL (ref 0.7–4.0)
Lymphs: 28 %
MCH: 28.5 pg (ref 26.6–33.0)
MCH: 28.3 pg (ref 26.0–34.0)
MCHC: 33.4 g/dL (ref 31.5–35.7)
MCHC: 32.9 g/dL (ref 30.0–36.0)
MCV: 85 fL (ref 79–97)
MCV: 86 fL (ref 80.0–100.0)
Monocytes Absolute: 0.5 10*3/uL (ref 0.1–0.9)
Monocytes Absolute: 0.5 10*3/uL (ref 0.1–1.0)
Monocytes Relative: 6 %
Monocytes: 6 %
Neutro Abs: 4.7 10*3/uL (ref 1.7–7.7)
Neutrophils Absolute: 5 10*3/uL (ref 1.4–7.0)
Neutrophils Relative %: 60 %
Neutrophils: 60 %
Platelets: 6 10*3/uL — CL (ref 150–450)
Platelets: 16 10*3/uL — CL (ref 150–400)
RBC: 6.01 x10E6/uL — ABNORMAL HIGH (ref 4.14–5.80)
RBC: 5.8 MIL/uL (ref 4.22–5.81)
RDW: 13.1 % (ref 11.6–15.4)
RDW: 12.6 % (ref 11.5–15.5)
WBC: 8.1 10*3/uL (ref 3.4–10.8)
WBC: 7.9 10*3/uL (ref 4.0–10.5)
nRBC: 0 % (ref 0.0–0.2)

## 2020-02-01 LAB — BASIC METABOLIC PANEL
Anion gap: 7 (ref 5–15)
BUN: 9 mg/dL (ref 6–20)
CO2: 27 mmol/L (ref 22–32)
Calcium: 9.3 mg/dL (ref 8.9–10.3)
Chloride: 106 mmol/L (ref 98–111)
Creatinine, Ser: 0.89 mg/dL (ref 0.61–1.24)
GFR calc Af Amer: >60 mL/min (ref >60)
GFR calc non Af Amer: >60 mL/min (ref >60)
Glucose, Bld: 100 mg/dL — ABNORMAL HIGH (ref 70–99)
Potassium: 4 mmol/L (ref 3.5–5.1)
Sodium: 140 mmol/L (ref 135–145)

## 2020-02-01 LAB — TYPE AND SCREEN
ABO/RH(D): O POS
Antibody Screen: NEGATIVE

## 2020-02-01 LAB — SAVE SMEAR(SSMR), FOR PROVIDER SLIDE REVIEW

## 2020-02-01 LAB — PROTIME-INR
INR: 0.9 (ref 0.9–1.2)
Prothrombin Time: 10 s (ref 9.1–12.0)

## 2020-02-01 LAB — HIV ANTIBODY (ROUTINE TESTING W REFLEX): HIV Screen 4th Generation wRfx: NONREACTIVE

## 2020-02-01 LAB — ABO/RH: ABO/RH(D): O POS

## 2020-02-01 LAB — SARS CORONAVIRUS 2 BY RT PCR (HOSPITAL ORDER, PERFORMED IN ~~LOC~~ HOSPITAL LAB): SARS Coronavirus 2: NEGATIVE

## 2020-02-01 LAB — APTT: aPTT: 26 s (ref 24–33)

## 2020-02-01 MED ORDER — SODIUM CHLORIDE 0.9 % IV SOLN: INTRAVENOUS | Status: DC

## 2020-02-01 MED ORDER — PREDNISONE 50 MG PO TABS
60.0000 mg | ORAL_TABLET | Freq: Every day | ORAL | Status: DC
  Administered 2020-02-01 – 2020-02-02 (×2): 60 mg via ORAL
  Filled 2020-02-01 (×2): qty 1

## 2020-02-01 NOTE — Progress Notes (Signed)
[place]  [phone] Fax:([phone]) [phone]     ID: Mark Curry DOB: 1985-12-06  Mark#: [id number]  BMW#:[id number]  Patient Care Team: Kathyrn Drown, MD as PCP - General (Family Medicine) Satira Sark, MD as PCP - Cardiology (Cardiology) Chauncey Cruel, MD OTHER MD:  CHIEF COMPLAINT: thrombocytopenia  CURRENT TREATMENT: prednisone   HISTORY OF CURRENT ILLNESS: Mark Curry has no prior history of blood problems and specifically his platelet counts have been normal until 01/31/2020, when he presented with petechiae.  Results for JONAVEN, HILGERS (MRN [id number]) as of 02/01/2020 14:33  Ref. Range 09/01/2017 08:17 09/19/2019 11:44 01/18/2020 17:14 01/31/2020 12:06 02/01/2020 11:08  Platelets Latest Ref Range: 150 - 400 K/uL 199 200 197 6 (LL) 16 (LL)   He had a viral-like illness in the past 2 weeks, with N/V/D documented 01/18/2020 in the ED. At that time KUB was unremarkable.  Today 02/01/2020 he was directed to the ED by his primary care MD and here his counts read out at 16K.  Aside from the petechiae he has had no bleeding or bruising. Prior dental extractions uncomplicated.  The patient's subsequent history is as detailed below.  INTERVAL HISTORY: I met with Mark Curry in the ED room, his wife Mark Curry present  REVIEW OF SYSTEMS: No fevers, rash, unexplained fatigue or unexplained weight loss. As noted no overt bleeding symptoms. Has had Pfizer Covid-19vaccine x 2 doses as has his wife. The rest of the ROS was noncontributory  PAST MEDICAL HISTORY: Past Medical History:  Diagnosis Date   No pertinent past medical history   seasonal allergies; history of "chest pressure" worked up by cardiology with no cardiac cause established (per pateint: records not in [place])  PAST SURGICAL HISTORY: Past Surgical History:  Procedure Laterality Date   TONSILLECTOMY    s/p wisdom teeth extraction  FAMILY HISTORY Family History  Problem Relation Age of Onset    Polycystic ovary syndrome Mother    Diabetes Father    Insulin resistance Sister    Multiple sclerosis Maternal Grandmother    Allergic rhinitis Brother    Angioedema Neg Hx    Asthma Neg Hx    Eczema Neg Hx    Immunodeficiency Neg Hx    Urticaria Neg Hx   No family historyof hematologic problems to the patient's knowledge; not in contact with his biological father, age 62 as of June 2021; mother is 8 as of June 2021; 8 siblings including 1/2 siblings  SOCIAL HISTORY: (June 2021) 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. Wife Mark Curry also works at [employer], "with international students." They have a 110 year old child. The patient attends [employer] in [place]: in the [place] any documents to the contrary the patient's spouse is his HCPOA   HEALTH MAINTENANCE: Social History   Tobacco Use   Smoking status: Never Smoker   Smokeless tobacco: Never Used  Vaping Use   Vaping Use: Never used  Substance Use Topics   Alcohol use: Not Currently   Drug use: Never       No Known Allergies  Current Facility-Administered Medications  Medication Dose Route Frequency Provider Last Rate Last Admin   0.9 %  sodium chloride infusion   Intravenous Continuous Lacretia Leigh, MD       Current Outpatient Medications  Medication Sig Dispense Refill   acetaminophen (PAIN RELIEF) 500 MG tablet Take 500 mg by mouth once as needed for mild pain.  neomycin-polymyxin-hydrocortisone (CORTISPORIN) OTIC solution Place 3 drops into the right ear 4 (four) times daily. (Patient not taking: Reported on 01/18/2020) 10 mL 0   ondansetron (ZOFRAN ODT) 4 MG disintegrating tablet Take 1 tablet (4 mg total) by mouth every 8 (eight) hours as needed for nausea or vomiting. (Patient not taking: Reported on 01/31/2020) 20 tablet 0   ondansetron (ZOFRAN ODT) 4 MG disintegrating tablet Take 1 tablet (4 mg total) by mouth every 8 (eight) hours as needed for  nausea or vomiting. (Patient not taking: Reported on 01/31/2020) 10 tablet 0   ondansetron (ZOFRAN) 4 MG tablet Take 1 tablet (4 mg total) by mouth every 8 (eight) hours as needed for nausea or vomiting. (Patient not taking: Reported on 01/31/2020) 4 tablet 0    OBJECTIVE: examined in bed  Vitals:   02/01/20 1055  BP: (!) 138/94  Pulse: 68  Resp: 16  Temp: 98 F (36.7 C)  SpO2: 96%     There is no height or weight on file to calculate BMI.   Wt Readings from Last 3 Encounters:  01/31/20 281 lb 3.2 oz (127.6 kg)  01/18/20 280 lb (127 kg)  01/07/20 284 lb 3.2 oz (128.9 kg)   BMI = 38.1    ECOG FS:1 - Symptomatic but completely ambulatory  Ocular: Sclerae unicteric, pupils round and equal Lymphatic: No cervical or supraclavicular adenopathy, no axillary adenopathy Lungs no rales or rhonchi Heart regular rate and rhythm Abd soft, nontender, positive bowel sounds MSK no focal spinal tenderness, no joint edema Neuro: non-focal, well-oriented, appropriate affect    LAB RESULTS:  CMP     Component Value Date/Time   NA 140 02/01/2020 1108   NA 140 09/01/2017 0817   K 4.0 02/01/2020 1108   CL 106 02/01/2020 1108   CO2 27 02/01/2020 1108   GLUCOSE 100 (H) 02/01/2020 1108   BUN 9 02/01/2020 1108   BUN 11 09/01/2017 0817   CREATININE 0.89 02/01/2020 1108   CALCIUM 9.3 02/01/2020 1108   PROT 8.0 01/18/2020 1714   PROT 7.3 09/20/2019 0937   ALBUMIN 4.7 01/18/2020 1714   ALBUMIN 4.7 09/20/2019 0937   AST 31 01/18/2020 1714   ALT 42 01/18/2020 1714   ALKPHOS 63 01/18/2020 1714   BILITOT 1.2 01/18/2020 1714   BILITOT 0.7 09/20/2019 0937   GFRNONAA >60 02/01/2020 1108   GFRAA >60 02/01/2020 1108    No results found for: TOTALPROTELP, ALBUMINELP, A1GS, A2GS, BETS, BETA2SER, GAMS, MSPIKE, SPEI  No results found for: KPAFRELGTCHN, LAMBDASER, KAPLAMBRATIO  Lab Results  Component Value Date   WBC 7.9 02/01/2020   NEUTROABS 4.7 02/01/2020   HGB 16.4 02/01/2020   HCT 49.9  02/01/2020   MCV 86.0 02/01/2020   PLT 16 (LL) 02/01/2020    _0 @  No results found for: LABCA2  No components found for: [id number]  Recent Labs  Lab 01/31/20 1206  INR 0.9    No results found for: LABCA2  No results found for: WGN562  No results found for: ZHY865  No results found for: HQI696  No results found for: CA2729  No components found for: HGQUANT  No results found for: CEA1 / No results found for: CEA1   No results found for: AFPTUMOR  No results found for: CHROMOGRNA  No results found for: PSA1  Admission on 02/01/2020  Component Date Value Ref Range Status   WBC 02/01/2020 7.9  4.0 - 10.5 K/uL Final   RBC 02/01/2020 5.80  4.22 - 5.81 MIL/uL  Final   Hemoglobin 02/01/2020 16.4  13.0 - 17.0 g/dL Final   HCT 02/01/2020 49.9  39 - 52 % Final   MCV 02/01/2020 86.0  80.0 - 100.0 fL Final   MCH 02/01/2020 28.3  26.0 - 34.0 pg Final   MCHC 02/01/2020 32.9  30.0 - 36.0 g/dL Final   RDW 02/01/2020 12.6  11.5 - 15.5 % Final   Platelets 02/01/2020 16* 150 - 400 K/uL Final   Comment: Immature Platelet Fraction may be clinically indicated, consider ordering this additional test [id number] PLATELET COUNT CONFIRMED BY SMEAR REPEATED TO VERIFY THIS CRITICAL RESULT HAS VERIFIED AND BEEN CALLED TO S WILSON RN BY ALLISON BENNETT ON 06 19 2021 AT 7, AND HAS BEEN READ BACK.     nRBC 02/01/2020 0.0  0.0 - 0.2 % Final   Neutrophils Relative % 02/01/2020 60  % Final   Neutro Abs 02/01/2020 4.7  1.7 - 7.7 K/uL Final   Lymphocytes Relative 02/01/2020 30  % Final   Lymphs Abs 02/01/2020 2.4  0.7 - 4.0 K/uL Final   Monocytes Relative 02/01/2020 6  % Final   Monocytes Absolute 02/01/2020 0.5  0 - 1 K/uL Final   Eosinophils Relative 02/01/2020 3  % Final   Eosinophils Absolute 02/01/2020 0.3  0 - 0 K/uL Final   Basophils Relative 02/01/2020 1  % Final   Basophils Absolute 02/01/2020 0.1  0 - 0 K/uL Final   Immature Granulocytes  02/01/2020 0  % Final   Abs Immature Granulocytes 02/01/2020 0.03  0.00 - 0.07 K/uL Final   Performed at [facility], [place] [address] [place], [place] [address]   Sodium 02/01/2020 140  135 - 145 mmol/L Final   Potassium 02/01/2020 4.0  3.5 - 5.1 mmol/L Final   Chloride 02/01/2020 106  98 - 111 mmol/L Final   CO2 02/01/2020 27  22 - 32 mmol/L Final   Glucose, Bld 02/01/2020 100* 70 - 99 mg/dL Final   Glucose reference range applies only to samples taken after fasting for at least 8 hours.   BUN 02/01/2020 9  6 - 20 mg/dL Final   Creatinine, Ser 02/01/2020 0.89  0.61 - 1.24 mg/dL Final   Calcium 02/01/2020 9.3  8.9 - 10.3 mg/dL Final   GFR calc non Af Amer 02/01/2020 >60  >60 mL/min Final   GFR calc Af Amer 02/01/2020 >60  >60 mL/min Final   Anion gap 02/01/2020 7  5 - 15 Final   Performed at [facility], [place] [address] [place], [place] [address]   ABO/RH(D) 02/01/2020 O POS   Final   Antibody Screen 02/01/2020 NEG   Final   Sample Expiration 02/01/2020    Final                   Value:02/04/2020,2359 Performed at [facility], [place] [address], [place], [place] [address]    Smear Review 02/01/2020 SMEAR STAINED AND AVAILABLE FOR REVIEW   Final   Performed at [facility], [place] [address] [place], [place] [address]  Office Visit on 01/31/2020  Component Date Value Ref Range Status   WBC 01/31/2020 8.1  3.4 - 10.8 x10E3/uL Final   RBC 01/31/2020 6.01* 4.14 - 5.80 x10E6/uL Final   Hemoglobin 01/31/2020 17.1  13.0 - 17.7 g/dL Final   Hematocrit 01/31/2020 51.2* 37.5 - 51.0 % Final   MCV 01/31/2020 85  79 - 97 fL Final   MCH 01/31/2020 28.5  26.6 - 33.0 pg Final   MCHC 01/31/2020 33.4  31 - 35 g/dL Final   RDW 01/31/2020 13.1  11.6 - 15.4 % Final   Platelets 01/31/2020 6* 150 - 450 x10E3/uL Final   Platelet count verified by examination of peripheral blood smear.   Neutrophils 01/31/2020 60  Not Estab. % Final   Lymphs 01/31/2020 28   Not Estab. % Final   Monocytes 01/31/2020 6  Not Estab. % Final   Eos 01/31/2020 4  Not Estab. % Final   Basos 01/31/2020 1  Not Estab. % Final   Neutrophils Absolute 01/31/2020 5.0  1 - 7 x10E3/uL Final   Lymphocytes Absolute 01/31/2020 2.2  0 - 3 x10E3/uL Final   Monocytes Absolute 01/31/2020 0.5  0 - 0 x10E3/uL Final   EOS (ABSOLUTE) 01/31/2020 0.3  0.0 - 0.4 x10E3/uL Final   Basophils Absolute 01/31/2020 0.1  0 - 0 x10E3/uL Final   Immature Granulocytes 01/31/2020 1  Not Estab. % Final   Immature Grans (Abs) 01/31/2020 0.0  0.0 - 0.1 x10E3/uL Final   Hematology Comments: 01/31/2020 Note:   Final   Verified by microscopic examination.   aPTT 01/31/2020 26  24 - 33 sec Final   Comment: This test has not been validated for monitoring unfractionated heparin therapy. aPTT-based therapeutic ranges for unfractionated heparin therapy have not been established. For general guidelines on Heparin monitoring, refer to the [employer].    INR 01/31/2020 0.9  0.9 - 1.2 Final   Comment: Reference interval is for non-anticoagulated patients. Suggested INR therapeutic range for Vitamin K antagonist therapy:    Standard Dose (moderate intensity                   therapeutic range):       2.0 - 3.0    Higher intensity therapeutic range       2.5 - 3.5    Prothrombin Time 01/31/2020 10.0  9.1 - 12.0 sec Final    (this displays the last labs from the last 3 days)  No results found for: TOTALPROTELP, ALBUMINELP, A1GS, A2GS, BETS, BETA2SER, GAMS, MSPIKE, SPEI (this displays SPEP labs)  No results found for: KPAFRELGTCHN, LAMBDASER, KAPLAMBRATIO (kappa/lambda light chains)  No results found for: HGBA, HGBA2QUANT, HGBFQUANT, HGBSQUAN (Hemoglobinopathy evaluation)   No results found for: LDH  No results found for: IRON, TIBC, IRONPCTSAT (Iron and TIBC)  No results found for: FERRITIN  Urinalysis    Component Value Date/Time   COLORURINE YELLOW 01/18/2020  [place] 01/18/2020 1638   LABSPEC 1.021 01/18/2020 1638   PHURINE 6.0 01/18/2020 1638   GLUCOSEU NEGATIVE 01/18/2020 1638   [place] 01/18/2020 [place] 01/18/2020 [place] 01/18/2020 1638   PROTEINUR NEGATIVE 01/18/2020 1638   UROBILINOGEN 0.2 01/17/2009 2213   NITRITE NEGATIVE 01/18/2020 [place] 01/18/2020 1638     STUDIES: DG ABD ACUTE 2+V W 1V CHEST  Result Date: 01/18/2020 CLINICAL DATA:  Nausea, vomiting, diarrhea, generalized abdominal pain EXAM: DG ABDOMEN ACUTE W/ 1V CHEST COMPARISON:  09/19/2019 FINDINGS: Supine and upright frontal views of the abdomen as well as an upright frontal view of the chest are obtained. Cardiac silhouette is unremarkable. No airspace disease, effusion, or pneumothorax. Bowel gas pattern is unremarkable without obstruction or ileus. No masses or abnormal calcifications. No free gas in the greater peritoneal sac. There are no acute bony abnormalities. IMPRESSION: 1. Unremarkable abdominal series. Electronically Signed  By: Randa Ngo M.D.   On: 01/18/2020 22:37   REVIEW OF THE BLOOD FILM 02/01/2020: no schistocytes, no significant anisocytosis, mild rouleaux; minimal left shift in the WBC, no dysplasia; no artefactual platelet clumops ELIGIBLE FOR AVAILABLE RESEARCH PROTOCOL: no  ASSESSMENT: 34 y.o. [place] Mark Curry presenting with petechiae, found to be profoundly thrombocytopenic, working diagnosis immune Thrombocytopenic purpura (ITP)  (a) prednisone started 02/01/2020 at 60 mg po daily  PLAN: We reviewed the fact that there are 3 types of blood cells and that 2 of the 3 cell lines in his case are normal. Specifically: red cells, which carry O2, are normal in number and shap-- no schistocytes seen, no anisocytosis; white cells, which are the immune system, are normal in number and morphology, with minimal leftshift; platelets, which are clotting cells, are very low  in number. No artefactual clumps are seen.  We discussed the many possible causes of thrombocytopenia in this pt who had normal counts as recently as 09/19/2019. The absence of schistocytes, normal Hb and renal function and the absence of symptoms r/o TTP or DIC. He is not on heparin. The working Dx accordingly is ITP and we discussed this today.  He understands this means hisimmune system is mistakenly attacking his platelets. The treatment is immunosuppression and we are starting prednisone 60 mg daily today. We discussed the possible toxicities, side effects and complications of this medication. He will be on this dose at least one week, after which we may be able to start a taper.  Since his platelets apear to already be recovering, Iwill nto write for IVIG. Will consider it if tomorrow counts remain <20K  If patient's counts are >20 K tomorrow and he remains clinically well he may be discharged. I will arrange for repeat CBC in my office 02/04/2020.  Please let me know if I can be of further help   Chauncey Cruel, MD   02/01/2020 2:29 PM Medical Oncology and Hematology [facility] [address] [place], [place] [address] Tel. [phone]    Fax. [phone]

## 2020-02-01 NOTE — ED Provider Notes (Signed)
[facility] EMERGENCY DEPARTMENT Provider Note   CSN: [id number] Arrival date & time: 02/01/20  1050     History Chief Complaint  Patient presents with  . Abnormal Lab    Mark Curry is a 34 y.o. male.  34 year old male presents after being told by his doctor that he had low platelets.  Patient had URI symptoms with gastroenteritis couple weeks ago but nothing recently.  No recent fever or chills.  Had noted some red dots on his chest and saw his doctor for this yesterday was told they were petechiae.  Was called this morning due to low platelets.  Denies any black or bloody stools.  No weakness and he feels at his baseline        Past Medical History:  Diagnosis Date  . No pertinent past medical history     Patient Active Problem List   Diagnosis Date Noted  . Fatty liver 03/15/2018  . Morbid obesity (HCC) 03/15/2018  . Elevated liver enzymes 03/15/2018  . Perennial and seasonal allergic rhinitis 11/06/2017  . Anosmia 11/06/2017    Past Surgical History:  Procedure Laterality Date  . TONSILLECTOMY         Family History  Problem Relation Age of Onset  . Polycystic ovary syndrome Mother   . Diabetes Father   . Insulin resistance Sister   . Multiple sclerosis Maternal Grandmother   . Allergic rhinitis Brother   . Angioedema Neg Hx   . Asthma Neg Hx   . Eczema Neg Hx   . Immunodeficiency Neg Hx   . Urticaria Neg Hx     Social History   Tobacco Use  . Smoking status: Never Smoker  . Smokeless tobacco: Never Used  Vaping Use  . Vaping Use: Never used  Substance Use Topics  . Alcohol use: Not Currently  . Drug use: Never    Home Medications Prior to Admission medications   Medication Sig Start Date End Date Taking? Authorizing Provider  acetaminophen (PAIN RELIEF) 500 MG tablet Take 500 mg by mouth once as needed for mild pain.    [provider]  neomycin-polymyxin-hydrocortisone (CORTISPORIN) OTIC solution Place 3  drops into the right ear 4 (four) times daily. Patient not taking: Reported on 01/18/2020 01/07/20   Merlyn Albert, MD  ondansetron (ZOFRAN ODT) 4 MG disintegrating tablet Take 1 tablet (4 mg total) by mouth every 8 (eight) hours as needed for nausea or vomiting. Patient not taking: Reported on 01/31/2020 01/18/20   Claudette Stapler C, PA-C  ondansetron (ZOFRAN ODT) 4 MG disintegrating tablet Take 1 tablet (4 mg total) by mouth every 8 (eight) hours as needed for nausea or vomiting. Patient not taking: Reported on 01/31/2020 01/18/20   Burgess Amor, PA-C  ondansetron (ZOFRAN) 4 MG tablet Take 1 tablet (4 mg total) by mouth every 8 (eight) hours as needed for nausea or vomiting. Patient not taking: Reported on 01/31/2020 01/18/20   Burgess Amor, PA-C    Allergies    Patient has no known allergies.  Review of Systems   Review of Systems  All other systems reviewed and are negative.   Physical Exam Updated Vital Signs BP (!) 138/94 (BP Location: Left Arm)   Pulse 68   Temp 98 F (36.7 C) (Oral)   Resp 16   SpO2 96%   Physical Exam Vitals and nursing note reviewed.  Constitutional:      General: He is not in acute distress.    Appearance: Normal  appearance. He is well-developed. He is not toxic-appearing.  HENT:     Head: Normocephalic and atraumatic.  Eyes:     General: Lids are normal.     Conjunctiva/sclera: Conjunctivae normal.     Pupils: Pupils are equal, round, and reactive to light.  Neck:     Thyroid: No thyroid mass.     Trachea: No tracheal deviation.  Cardiovascular:     Rate and Rhythm: Normal rate and regular rhythm.     Heart sounds: Normal heart sounds. No murmur heard.  No gallop.   Pulmonary:     Effort: Pulmonary effort is normal. No respiratory distress.     Breath sounds: Normal breath sounds. No stridor. No decreased breath sounds, wheezing, rhonchi or rales.  Abdominal:     General: Bowel sounds are normal. There is no distension.     Palpations: Abdomen is  soft.     Tenderness: There is no abdominal tenderness. There is no rebound.  Musculoskeletal:        General: No tenderness. Normal range of motion.     Cervical back: Normal range of motion and neck supple.  Skin:    General: Skin is warm and dry.     Findings: No abrasion or rash.     Comments: Petechiae noted on patient's anterior chest  Neurological:     Mental Status: He is alert and oriented to person, place, and time.     GCS: GCS eye subscore is 4. GCS verbal subscore is 5. GCS motor subscore is 6.     Cranial Nerves: No cranial nerve deficit.     Sensory: No sensory deficit.  Psychiatric:        Speech: Speech normal.        Behavior: Behavior normal.     ED Results / Procedures / Treatments   Labs (all labs ordered are listed, but only abnormal results are displayed) Labs Reviewed  CBC WITH DIFFERENTIAL/PLATELET - Abnormal; Notable for the following components:      Result Value   Platelets 16 (*)    All other components within normal limits  BASIC METABOLIC PANEL - Abnormal; Notable for the following components:   Glucose, Bld 100 (*)    All other components within normal limits  TYPE AND SCREEN    EKG None  Radiology No results found.  Procedures Procedures (including critical care time)  Medications Ordered in ED Medications  0.9 %  sodium chloride infusion (has no administration in time range)    ED Course  I have reviewed the triage vital signs and the nursing notes.  Pertinent labs & imaging results that were available during my care of the patient were reviewed by me and considered in my medical decision making (see chart for details).    MDM Rules/Calculators/A&P                          Thrombocytopenia noted and likely consistent with ITP.  Case discussed with Dr. Jana Hakim who will see the patient in consultation.  Will admit to the hospitalist service Final Clinical Impression(s) / ED Diagnoses Final diagnoses:  None    Rx / DC  Orders ED Discharge Orders    None       Lacretia Leigh, MD 02/01/20 1252

## 2020-02-01 NOTE — ED Triage Notes (Signed)
Pt. Stated, I m here cause my Dr. Jeanene Erb and said my platelet were low.

## 2020-02-01 NOTE — Progress Notes (Signed)
NEW ADMISSION NOTE New Admission Note:   Arrival Method:  Patient arrived from ED accompanied by wife. Mental Orientation: Alert and oriented x 4. Telemetry: N/A Assessment: Completed Skin: Rash noted on the upper chest, back and bilateral legs.  Warm dry and intact. IV: R hand and L hand SL. Pain:  Denies any pain. Tubes: N/A Safety Measures: Safety Fall Prevention Plan has been given, discussed and signed Admission: Completed [facility] Orientation: Patient has been orientated to the room, unit and staff.    Orders have been reviewed and implemented. Will continue to monitor the patient. Call light has been placed within reach and bed alarm has been activated.   Arvilla Meres, RN

## 2020-02-01 NOTE — H&P (Signed)
History and Physical    Mark Curry AST:[id number] DOB: 03/19/1986 DOA: 02/01/2020  PCP: Kathyrn Drown, MD (Confirm with patient/family/NH records and if not entered, this has to be entered at [facility] point of entry) Patient coming from: Home  I have personally briefly reviewed patient's old medical records in [place]  Chief Complaint: Rash  HPI: Mark Curry is a 34 y.o. male with no significant medical history presented with new onset of rash.  Patient has been in his usual state of health, and he developed new rash around bilateral shoulders and upper chest 2 days ago after shower.  He denied any bleeding on gum, no hematuria, no melena, no easy bruising, no fever chills no chest pains.  He went to his PCP who performed a CBC yesterday showed platelet 6, thus asked him to come to the ER. Two weeks ago, patient was in ER for evaluation of GI symptoms of 2 day history of nausea, vomiting and diarrhea with generalized abd pain and bloating.  Labs showed normal wbc count and PLT level, no evidence of dehydration or electrolyte abnormalities.  He was given zofran and morphine, pain improved and sent home. ED Course: Repeat platelets today 16.  2 weeks ago, his platelet was 197.  Review of Systems: As per HPI otherwise 10 point review of systems negative.    Past Medical History:  Diagnosis Date  . No pertinent past medical history     Past Surgical History:  Procedure Laterality Date  . TONSILLECTOMY       reports that he has never smoked. He has never used smokeless tobacco. He reports previous alcohol use. He reports that he does not use drugs.  No Known Allergies  Family History  Problem Relation Age of Onset  . Polycystic ovary syndrome Mother   . Diabetes Father   . Insulin resistance Sister   . Multiple sclerosis Maternal Grandmother   . Allergic rhinitis Brother   . Angioedema Neg Hx   . Asthma Neg Hx   . Eczema Neg Hx   . Immunodeficiency Neg Hx   .  Urticaria Neg Hx      Prior to Admission medications   Medication Sig Start Date End Date Taking? Authorizing Provider  acetaminophen (PAIN RELIEF) 500 MG tablet Take 500 mg by mouth once as needed for mild pain.    [provider]  neomycin-polymyxin-hydrocortisone (CORTISPORIN) OTIC solution Place 3 drops into the right ear 4 (four) times daily. Patient not taking: Reported on 01/18/2020 01/07/20   Mikey Kirschner, MD  ondansetron (ZOFRAN ODT) 4 MG disintegrating tablet Take 1 tablet (4 mg total) by mouth every 8 (eight) hours as needed for nausea or vomiting. Patient not taking: Reported on 01/31/2020 01/18/20   Charmaine Downs C, PA-C  ondansetron (ZOFRAN ODT) 4 MG disintegrating tablet Take 1 tablet (4 mg total) by mouth every 8 (eight) hours as needed for nausea or vomiting. Patient not taking: Reported on 01/31/2020 01/18/20   Evalee Jefferson, PA-C  ondansetron (ZOFRAN) 4 MG tablet Take 1 tablet (4 mg total) by mouth every 8 (eight) hours as needed for nausea or vomiting. Patient not taking: Reported on 01/31/2020 01/18/20   Evalee Jefferson, PA-C    Physical Exam: Vitals:   02/01/20 1055  BP: (!) 138/94  Pulse: 68  Resp: 16  Temp: 98 F (36.7 C)  TempSrc: Oral  SpO2: 96%    Constitutional: NAD, calm, comfortable Vitals:   02/01/20 1055  BP: Marland Kitchen)  138/94  Pulse: 68  Resp: 16  Temp: 98 F (36.7 C)  TempSrc: Oral  SpO2: 96%   Eyes: PERRL, lids and conjunctivae normal ENMT: Mucous membranes are moist. Posterior pharynx clear of any exudate or lesions.Normal dentition.  Neck: normal, supple, no masses, no thyromegaly.  Nonblanchable petechia on bilateral shoulders and upper chest Respiratory: clear to auscultation bilaterally, no wheezing, no crackles. Normal respiratory effort. No accessory muscle use.  Cardiovascular: Regular rate and rhythm, no murmurs / rubs / gallops. No extremity edema. 2+ pedal pulses. No carotid bruits.  Abdomen: no tenderness, no masses palpated. No  hepatosplenomegaly. Bowel sounds positive.  Musculoskeletal: no clubbing / cyanosis. No joint deformity upper and lower extremities. Good ROM, no contractures. Normal muscle tone.  Skin: no rashes, lesions, ulcers. No induration Neurologic: CN 2-12 grossly intact. Sensation intact, DTR normal. Strength 5/5 in all 4.  Psychiatric: Normal judgment and insight. Alert and oriented x 3. Normal mood.     Labs on Admission: I have personally reviewed following labs and imaging studies  CBC: Recent Labs  Lab 01/31/20 1206 02/01/20 1108  WBC 8.1 7.9  NEUTROABS 5.0 4.7  HGB 17.1 16.4  HCT 51.2* 49.9  MCV 85 86.0  PLT 6* 16*   Basic Metabolic Panel: Recent Labs  Lab 02/01/20 1108  NA 140  K 4.0  CL 106  CO2 27  GLUCOSE 100*  BUN 9  CREATININE 0.89  CALCIUM 9.3   GFR: Estimated Creatinine Clearance: 163 mL/min (by C-G formula based on SCr of 0.89 mg/dL). Liver Function Tests: No results for input(s): AST, ALT, ALKPHOS, BILITOT, PROT, ALBUMIN in the last 168 hours. No results for input(s): LIPASE, AMYLASE in the last 168 hours. No results for input(s): AMMONIA in the last 168 hours. Coagulation Profile: Recent Labs  Lab 01/31/20 1206  INR 0.9   Cardiac Enzymes: No results for input(s): CKTOTAL, CKMB, CKMBINDEX, TROPONINI in the last 168 hours. BNP (last 3 results) No results for input(s): PROBNP in the last 8760 hours. HbA1C: No results for input(s): HGBA1C in the last 72 hours. CBG: No results for input(s): GLUCAP in the last 168 hours. Lipid Profile: No results for input(s): CHOL, HDL, LDLCALC, TRIG, CHOLHDL, LDLDIRECT in the last 72 hours. Thyroid Function Tests: No results for input(s): TSH, T4TOTAL, FREET4, T3FREE, THYROIDAB in the last 72 hours. Anemia Panel: No results for input(s): VITAMINB12, FOLATE, FERRITIN, TIBC, IRON, RETICCTPCT in the last 72 hours. Urine analysis:    Component Value Date/Time   COLORURINE YELLOW 01/18/2020 1638   APPEARANCEUR CLEAR  01/18/2020 1638   LABSPEC 1.021 01/18/2020 1638   PHURINE 6.0 01/18/2020 1638   GLUCOSEU NEGATIVE 01/18/2020 1638   HGBUR NEGATIVE 01/18/2020 1638   BILIRUBINUR NEGATIVE 01/18/2020 1638   KETONESUR NEGATIVE 01/18/2020 1638   PROTEINUR NEGATIVE 01/18/2020 1638   UROBILINOGEN 0.2 01/17/2009 2213   NITRITE NEGATIVE 01/18/2020 1638   LEUKOCYTESUR NEGATIVE 01/18/2020 1638    Radiological Exams on Admission: No results found.  EKG: Independently reviewed. NSR, no acute ST-T changes  Assessment/Plan Active Problems:   Thrombocytopenia (HCC)  Acute thrombocytopenia -Suspect ITP, given pt had a course of GI symptoms two weeks ago and then sudden drop of PLT count today. Hematology was contacted and plans to come in to examine blood smear to decide treatment, probably will be steroid plus IVIG defer to hematologist. -No S/S of active bleeding, will monitor CBC everyday, hold of PLT transfusion for now. -Hold chemical DVT prophylaxis -Check HIV screening  Morbid obesity -  Recommend weight loss   DVT prophylaxis: Ambulation ad lib Code Status: Full Family Communication: Wife Disposition Plan: May need 2-3 days if decided IVIG and/or steroid infusion Consults called: Hematology Admission status: Medsurg   Emeline General MD [facility] Pager [phone]    02/01/2020, 1:33 PM

## 2020-02-02 DIAGNOSIS — D693 Immune thrombocytopenic purpura: Secondary | ICD-10-CM

## 2020-02-02 DIAGNOSIS — D6959 Other secondary thrombocytopenia: Secondary | ICD-10-CM

## 2020-02-02 DIAGNOSIS — R748 Abnormal levels of other serum enzymes: Secondary | ICD-10-CM

## 2020-02-02 LAB — CBC
HCT: 47.8 % (ref 39.0–52.0)
Hemoglobin: 16.2 g/dL (ref 13.0–17.0)
MCH: 28.4 pg (ref 26.0–34.0)
MCHC: 33.9 g/dL (ref 30.0–36.0)
MCV: 83.7 fL (ref 80.0–100.0)
Platelets: 45 10*3/uL — ABNORMAL LOW (ref 150–400)
RBC: 5.71 MIL/uL (ref 4.22–5.81)
RDW: 12.1 % (ref 11.5–15.5)
WBC: 11.3 10*3/uL — ABNORMAL HIGH (ref 4.0–10.5)
nRBC: 0 % (ref 0.0–0.2)

## 2020-02-02 LAB — GLUCOSE, CAPILLARY: Glucose-Capillary: 189 mg/dL — ABNORMAL HIGH (ref 70–99)

## 2020-02-02 MED ORDER — PREDNISONE 20 MG PO TABS: 60.0000 mg | ORAL_TABLET | Freq: Every day | ORAL | 0 refills | Status: DC

## 2020-02-02 NOTE — Progress Notes (Signed)
Mark Curry   DOB:1985/11/15   OE#:[id number]   HWE#:[id number]  Subjective:  Comfortable in bed enjoying some Father's Day candy; wife in room; Some insomnia due to prednisone; had a cramp in his left leg last night; no other events; multiple questions   Objective: white man examined in bed Vitals:   02/02/20 0609 02/02/20 0940  BP: 134/78 140/72  Pulse: 72 79  Resp: 18 18  Temp: 97.9 F (36.6 C) 99.1 F (37.3 C)  SpO2: 94% 97%    Body mass index is 37.7 kg/m.  Intake/Output Summary (Last 24 hours) at 02/02/2020 1008 Last data filed at 02/02/2020 0200 Gross per 24 hour  Intake 240 ml  Output --  Net 240 ml     CBG (last 3)  No results for input(s): GLUCAP in the last 72 hours.   Labs:  Lab Results  Component Value Date   WBC 11.3 (H) 02/02/2020   HGB 16.2 02/02/2020   HCT 47.8 02/02/2020   MCV 83.7 02/02/2020   PLT 45 (L) 02/02/2020   NEUTROABS 4.7 02/01/2020    @LASTCHEMISTRY @  Urine Studies No results for input(s): UHGB, CRYS in the last 72 hours.  Invalid input(s): UACOL, UAPR, USPG, UPH, UTP, UGL, UKET, UBIL, UNIT, UROB, ULEU, UEPI, UWBC, URBC, UBAC, CAST, UCOM, BILUA  Basic Metabolic Panel: Recent Labs  Lab 02/01/20 1108  NA 140  K 4.0  CL 106  CO2 27  GLUCOSE 100*  BUN 9  CREATININE 0.89  CALCIUM 9.3   GFR Estimated Creatinine Clearance: 162 mL/min (by C-G formula based on SCr of 0.89 mg/dL). Liver Function Tests: No results for input(s): AST, ALT, ALKPHOS, BILITOT, PROT, ALBUMIN in the last 168 hours. No results for input(s): LIPASE, AMYLASE in the last 168 hours. No results for input(s): AMMONIA in the last 168 hours. Coagulation profile Recent Labs  Lab 01/31/20 1206  INR 0.9    CBC: Recent Labs  Lab 01/31/20 1206 02/01/20 1108 02/02/20 0432  WBC 8.1 7.9 11.3*  NEUTROABS 5.0 4.7  --   HGB 17.1 16.4 16.2  HCT 51.2* 49.9 47.8  MCV 85 86.0 83.7  PLT 6* 16* 45*   Cardiac Enzymes: No results for input(s): CKTOTAL, CKMB,  CKMBINDEX, TROPONINI in the last 168 hours. BNP: Invalid input(s): POCBNP CBG: No results for input(s): GLUCAP in the last 168 hours. D-Dimer No results for input(s): DDIMER in the last 72 hours. Hgb A1c No results for input(s): HGBA1C in the last 72 hours. Lipid Profile No results for input(s): CHOL, HDL, LDLCALC, TRIG, CHOLHDL, LDLDIRECT in the last 72 hours. Thyroid function studies No results for input(s): TSH, T4TOTAL, T3FREE, THYROIDAB in the last 72 hours.  Invalid input(s): FREET3 Anemia work up No results for input(s): VITAMINB12, FOLATE, FERRITIN, TIBC, IRON, RETICCTPCT in the last 72 hours. Microbiology Recent Results (from the past 240 hour(s))  SARS Coronavirus 2 by RT PCR (hospital order, performed in [facility] hospital lab) Nasopharyngeal Nasopharyngeal Swab     Status: None   Collection Time: 02/01/20  2:41 PM   Specimen: Nasopharyngeal Swab  Result Value Ref Range Status   SARS Coronavirus 2 NEGATIVE NEGATIVE Final    Comment: (NOTE) SARS-CoV-2 target nucleic acids are NOT DETECTED.  The SARS-CoV-2 RNA is generally detectable in upper and lower respiratory specimens during the acute phase of infection. The lowest concentration of SARS-CoV-2 viral copies this assay can detect is 250 copies / mL. A negative result does not preclude SARS-CoV-2 infection and should not be  used as the sole basis for treatment or other patient management decisions.  A negative result may occur with improper specimen collection / handling, submission of specimen other than nasopharyngeal swab, presence of viral mutation(s) within the areas targeted by this assay, and inadequate number of viral copies (<250 copies / mL). A negative result must be combined with clinical observations, patient history, and epidemiological information.  Fact Sheet for Patients:   StrictlyIdeas.no  Fact Sheet for Healthcare  Providers: BankingDealers.co.za  This test is not yet approved or  cleared by the [place] FDA and has been authorized for detection and/or diagnosis of SARS-CoV-2 by FDA under an Emergency Use Authorization (EUA).  This EUA will remain in effect (meaning this test can be used) for the duration of the COVID-19 declaration under Section 564(b)(1) of the Act, 21 U.S.C. section 360bbb-3(b)(1), unless the authorization is terminated or revoked sooner.  Performed at [facility], [place] [address] [place], [place] [address]       Studies:  No results found.  Assessment: 34 y.o. [place] man presenting with petechiae, found to be profoundly thrombocytopenic, working diagnosis immune Thrombocytopenic purpura (ITP)  (a) prednisone started 02/01/2020 at 60 mg po daily    Plan:  Platelets this AM 45K; non-fasting glc (he was eating "Father's Day candy") 189.  No problem with d/c this AM. He will have repeat labs 6/22 and weekly until problem resolved  Will sign off at this time   Chauncey Cruel, MD 02/02/2020  10:08 AM Medical Oncology and Hematology [facility] [place], [place] [address] Tel. [phone]    Fax. [phone]

## 2020-02-02 NOTE — Progress Notes (Addendum)
Mark Curry to be discharged Home per MD order. Discussed prescriptions and follow up appointments with the patient and his wife. Prescriptions given to patient; medication list explained in detail. Patient verbalized understanding.  Skin clean, dry and intact without evidence of skin break down, no evidence of skin tears noted. IV catheter discontinued intact. Site without signs and symptoms of complications. Dressing and pressure applied. Pt denies pain at the site currently. No complaints noted.  Patient free of lines, drains, and wounds.   An After Visit Summary (AVS) was printed and given to the patient. Patient refused to be escorted and walked out of the unit accompanied by his wife.  Arvilla Meres, RN

## 2020-02-02 NOTE — Discharge Summary (Signed)
Physician Discharge Summary  CEDARIUS KERSH OFB:[id number] DOB: 16-Nov-1985 DOA: 02/01/2020  PCP: Kathyrn Drown, MD  Admit date: 02/01/2020 Discharge date: 02/02/2020  Admitted From: Home Disposition:  Home  Recommendations for Outpatient Follow-up:  1. Follow up with Hematology in 1-2 weeks 2. Please obtain BMP/CBC in one week   Home Health:No Equipment/Devices:None Discharge Condition:Stable CODE STATUS:Full Diet recommendation: Heart Healthy  Brief/Interim Summary: 34 y.o. male with no significant medical history presented with new onset of rash.  Patient has been in his usual state of health, and he developed new rash around bilateral shoulders and upper chest 2 days ago after shower.  He denied any bleeding on gum, no hematuria, no melena, no easy bruising, no fever chills no chest pains.  He went to his PCP who performed a CBC yesterday showed platelet 6, thus asked him to come to the ER. Two weeks ago, patient was in ER for evaluation of GI symptoms of 2 day history of nausea, vomiting and diarrhea with generalized abd pain and bloating. Labs showed normal wbc count and PLT level, no evidence of dehydration or electrolyte abnormalities.   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  2-3 red cells are normal which points to creatinine. He will follow up with hematology as an outpatient repeat CBC. We will continue steroids as an outpatient.  HIV was nonreactive.  Discharge Instructions  Discharge Instructions    Diet - low sodium heart healthy   Complete by: As directed    Increase activity slowly   Complete by: As directed      Allergies as of 02/02/2020   No Known Allergies     Medication List    TAKE these medications   neomycin-polymyxin-hydrocortisone OTIC solution Commonly known as:  CORTISPORIN Place 3 drops into the right ear 4 (four) times daily.   ondansetron 4 MG disintegrating tablet Commonly known as: Zofran ODT Take 1 tablet (4 mg total) by mouth every 8 (eight) hours as needed for nausea or vomiting.   ondansetron 4 MG disintegrating tablet Commonly known as: Zofran ODT Take 1 tablet (4 mg total) by mouth every 8 (eight) hours as needed for nausea or vomiting.   ondansetron 4 MG tablet Commonly known as: ZOFRAN Take 1 tablet (4 mg total) by mouth every 8 (eight) hours as needed for nausea or vomiting.   Pain Relief 500 MG tablet Generic drug: acetaminophen Take 500 mg by mouth once as needed for mild pain.   predniSONE 20 MG tablet Commonly known as: DELTASONE Take 3 tablets (60 mg total) by mouth daily.       No Known Allergies  Consultations:  Hematology   Procedures/Studies: DG ABD ACUTE 2+V W 1V CHEST  Result Date: 01/18/2020 CLINICAL DATA:  Nausea, vomiting, diarrhea, generalized abdominal pain EXAM: DG ABDOMEN ACUTE W/ 1V CHEST COMPARISON:  09/19/2019 FINDINGS: Supine and upright frontal views of the abdomen as well as an upright frontal view of the chest are obtained. Cardiac silhouette is unremarkable. No airspace disease, effusion, or pneumothorax. Bowel gas pattern is unremarkable without obstruction or ileus. No masses or abnormal calcifications. No free gas in the greater peritoneal sac. There are no acute bony abnormalities. IMPRESSION: 1. Unremarkable abdominal series. Electronically Signed   By: Randa Ngo M.D.   On: 01/18/2020 22:37    Subjective:  No complaints feels  great. Discharge Exam: Vitals:   02/02/20 0029 02/02/20 0609  BP: 128/73 134/78  Pulse: 84 72  Resp: 18 18  Temp: 98.7 F (37.1 C) 97.9 F (36.6 C)  SpO2: 94% 94%   Vitals:   02/01/20 1513 02/01/20 2201 02/02/20 0029 02/02/20 0609  BP: 140/88 125/75 128/73 134/78  Pulse: 74 76 84 72  Resp: 16 18 18 18   Temp: 98.2 F (36.8 C) 98.8 F (37.1 C)  98.7 F (37.1 C) 97.9 F (36.6 C)  TempSrc: Oral Oral Oral   SpO2: 95% 96% 94% 94%  Weight: 126.1 kg     Height: 6' (1.829 m)       General: Pt is alert, awake, not in acute distress Cardiovascular: RRR, S1/S2 +, no rubs, no gallops Respiratory: CTA bilaterally, no wheezing, no rhonchi Abdominal: Soft, NT, ND, bowel sounds + Extremities: no edema, no cyanosis    The results of significant diagnostics from this hospitalization (including imaging, microbiology, ancillary and laboratory) are listed below for reference.     Microbiology: Recent Results (from the past 240 hour(s))  SARS Coronavirus 2 by RT PCR (hospital order, performed in [facility] hospital lab) Nasopharyngeal Nasopharyngeal Swab     Status: None   Collection Time: 02/01/20  2:41 PM   Specimen: Nasopharyngeal Swab  Result Value Ref Range Status   SARS Coronavirus 2 NEGATIVE NEGATIVE Final    Comment: (NOTE) SARS-CoV-2 target nucleic acids are NOT DETECTED.  The SARS-CoV-2 RNA is generally detectable in upper and lower respiratory specimens during the acute phase of infection. The lowest concentration of SARS-CoV-2 viral copies this assay can detect is 250 copies / mL. A negative result does not preclude SARS-CoV-2 infection and should not be used as the sole basis for treatment or other patient management decisions.  A negative result may occur with improper specimen collection / handling, submission of specimen other than nasopharyngeal swab, presence of viral mutation(s) within the areas targeted by this assay, and inadequate number of viral copies (<250 copies / mL). A negative result must be combined with clinical observations, patient history, and epidemiological information.  Fact Sheet for Patients:   02/03/20  Fact Sheet for Healthcare Providers: BoilerBrush.com.cy  This test is not yet approved or  cleared by the https://pope.com/ FDA and has  been authorized for detection and/or diagnosis of SARS-CoV-2 by FDA under an Emergency Use Authorization (EUA).  This EUA will remain in effect (meaning this test can be used) for the duration of the COVID-19 declaration under Section 564(b)(1) of the Act, 21 U.S.C. section 360bbb-3(b)(1), unless the authorization is terminated or revoked sooner.  Performed at [facility], [address], [place], [place]      Labs: BNP (last 3 results) No results for input(s): BNP in the last 8760 hours. Basic Metabolic Panel: Recent Labs  Lab 02/01/20 1108  NA 140  K 4.0  CL 106  CO2 27  GLUCOSE 100*  BUN 9  CREATININE 0.89  CALCIUM 9.3   Liver Function Tests: No results for input(s): AST, ALT, ALKPHOS, BILITOT, PROT, ALBUMIN in the last 168 hours. No results for input(s): LIPASE, AMYLASE in the last 168 hours. No results for input(s): AMMONIA in the last 168 hours. CBC: Recent Labs  Lab 01/31/20 1206 02/01/20 1108 02/02/20 0432  WBC 8.1 7.9 11.3*  NEUTROABS 5.0 4.7  --   HGB 17.1 16.4 16.2  HCT 51.2* 49.9 47.8  MCV 85 86.0 83.7  PLT 6* 16* 45*  Cardiac Enzymes: No results for input(s): CKTOTAL, CKMB, CKMBINDEX, TROPONINI in the last 168 hours. BNP: Invalid input(s): POCBNP CBG: No results for input(s): GLUCAP in the last 168 hours. D-Dimer No results for input(s): DDIMER in the last 72 hours. Hgb A1c No results for input(s): HGBA1C in the last 72 hours. Lipid Profile No results for input(s): CHOL, HDL, LDLCALC, TRIG, CHOLHDL, LDLDIRECT in the last 72 hours. Thyroid function studies No results for input(s): TSH, T4TOTAL, T3FREE, THYROIDAB in the last 72 hours.  Invalid input(s): FREET3 Anemia work up No results for input(s): VITAMINB12, FOLATE, FERRITIN, TIBC, IRON, RETICCTPCT in the last 72 hours. Urinalysis    Component Value Date/Time   COLORURINE YELLOW 01/18/2020 1638   APPEARANCEUR CLEAR 01/18/2020 1638   LABSPEC 1.021 01/18/2020 1638    PHURINE 6.0 01/18/2020 1638   GLUCOSEU NEGATIVE 01/18/2020 1638   HGBUR NEGATIVE 01/18/2020 1638   BILIRUBINUR NEGATIVE 01/18/2020 1638   KETONESUR NEGATIVE 01/18/2020 1638   PROTEINUR NEGATIVE 01/18/2020 1638   UROBILINOGEN 0.2 01/17/2009 2213   NITRITE NEGATIVE 01/18/2020 1638   LEUKOCYTESUR NEGATIVE 01/18/2020 1638   Sepsis Labs Invalid input(s): PROCALCITONIN,  WBC,  LACTICIDVEN Microbiology Recent Results (from the past 240 hour(s))  SARS Coronavirus 2 by RT PCR (hospital order, performed in [facility] hospital lab) Nasopharyngeal Nasopharyngeal Swab     Status: None   Collection Time: 02/01/20  2:41 PM   Specimen: Nasopharyngeal Swab  Result Value Ref Range Status   SARS Coronavirus 2 NEGATIVE NEGATIVE Final    Comment: (NOTE) SARS-CoV-2 target nucleic acids are NOT DETECTED.  The SARS-CoV-2 RNA is generally detectable in upper and lower respiratory specimens during the acute phase of infection. The lowest concentration of SARS-CoV-2 viral copies this assay can detect is 250 copies / mL. A negative result does not preclude SARS-CoV-2 infection and should not be used as the sole basis for treatment or other patient management decisions.  A negative result may occur with improper specimen collection / handling, submission of specimen other than nasopharyngeal swab, presence of viral mutation(s) within the areas targeted by this assay, and inadequate number of viral copies (<250 copies / mL). A negative result must be combined with clinical observations, patient history, and epidemiological information.  Fact Sheet for Patients:   BoilerBrush.com.cy  Fact Sheet for Healthcare Providers: https://pope.com/  This test is not yet approved or  cleared by the [place] FDA and has been authorized for detection and/or diagnosis of SARS-CoV-2 by FDA under an Emergency Use Authorization (EUA).  This EUA will remain in effect  (meaning this test can be used) for the duration of the COVID-19 declaration under Section 564(b)(1) of the Act, 21 U.S.C. section 360bbb-3(b)(1), unless the authorization is terminated or revoked sooner.  Performed at [facility], [address] [place], [place] [address]      Time coordinating discharge: Over 40 minutes  SIGNED:   Marinda Elk, MD  [facility] 02/02/2020, 8:21 AM Pager   If 7PM-7AM, please contact night-coverage [email or web address] Password [email or web address]

## 2020-02-03 ENCOUNTER — Encounter: Payer: Self-pay | Admitting: Family Medicine

## 2020-02-03 ENCOUNTER — Ambulatory Visit (HOSPITAL_COMMUNITY)
Admission: RE | Admit: 2020-02-03 | Discharge: 2020-02-03 | Disposition: A | Discharge status: Home / Self Care | Payer: BC Managed Care – PPO | Source: Ambulatory Visit | Attending: Family Medicine | Admitting: Family Medicine

## 2020-02-03 ENCOUNTER — Other Ambulatory Visit (HOSPITAL_COMMUNITY)
Admission: RE | Admit: 2020-02-03 | Discharge: 2020-02-03 | Disposition: A | Discharge status: Home / Self Care | Payer: BC Managed Care – PPO | Source: Ambulatory Visit | Attending: Family Medicine | Admitting: Family Medicine

## 2020-02-03 ENCOUNTER — Other Ambulatory Visit: Payer: Self-pay

## 2020-02-03 ENCOUNTER — Ambulatory Visit: Payer: BC Managed Care – PPO | Admitting: Family Medicine

## 2020-02-03 ENCOUNTER — Other Ambulatory Visit: Payer: Self-pay | Admitting: *Deleted

## 2020-02-03 VITALS — BP 128/84 | Temp 96.2°F | Wt 284.4 lb

## 2020-02-03 DIAGNOSIS — D693 Immune thrombocytopenic purpura: Secondary | ICD-10-CM

## 2020-02-03 DIAGNOSIS — R252 Cramp and spasm: Secondary | ICD-10-CM | POA: Diagnosis not present

## 2020-02-03 DIAGNOSIS — M79662 Pain in left lower leg: Secondary | ICD-10-CM | POA: Insufficient documentation

## 2020-02-03 DIAGNOSIS — D696 Thrombocytopenia, unspecified: Secondary | ICD-10-CM

## 2020-02-03 LAB — CBC WITH DIFFERENTIAL/PLATELET
Abs Immature Granulocytes: 0.04 10*3/uL (ref 0.00–0.07)
Basophils Absolute: 0 10*3/uL (ref 0.0–0.1)
Basophils Relative: 0 %
Eosinophils Absolute: 0 10*3/uL (ref 0.0–0.5)
Eosinophils Relative: 0 %
HCT: 48.9 % (ref 39.0–52.0)
Hemoglobin: 16.2 g/dL (ref 13.0–17.0)
Immature Granulocytes: 0 %
Lymphocytes Relative: 13 %
Lymphs Abs: 1.3 10*3/uL (ref 0.7–4.0)
MCH: 28.4 pg (ref 26.0–34.0)
MCHC: 33.1 g/dL (ref 30.0–36.0)
MCV: 85.8 fL (ref 80.0–100.0)
Monocytes Absolute: 0.2 10*3/uL (ref 0.1–1.0)
Monocytes Relative: 2 %
Neutro Abs: 8.5 10*3/uL — ABNORMAL HIGH (ref 1.7–7.7)
Neutrophils Relative %: 85 %
Platelets: 121 10*3/uL — ABNORMAL LOW (ref 150–400)
RBC: 5.7 MIL/uL (ref 4.22–5.81)
RDW: 12.9 % (ref 11.5–15.5)
WBC: 10.1 10*3/uL (ref 4.0–10.5)
nRBC: 0 % (ref 0.0–0.2)

## 2020-02-03 LAB — PATHOLOGIST SMEAR REVIEW

## 2020-02-03 LAB — BASIC METABOLIC PANEL
Anion gap: 10 (ref 5–15)
BUN: 13 mg/dL (ref 6–20)
CO2: 23 mmol/L (ref 22–32)
Calcium: 9 mg/dL (ref 8.9–10.3)
Chloride: 104 mmol/L (ref 98–111)
Creatinine, Ser: 0.98 mg/dL (ref 0.61–1.24)
GFR calc Af Amer: >60 mL/min (ref >60)
GFR calc non Af Amer: >60 mL/min (ref >60)
Glucose, Bld: 128 mg/dL — ABNORMAL HIGH (ref 70–99)
Potassium: 4 mmol/L (ref 3.5–5.1)
Sodium: 137 mmol/L (ref 135–145)

## 2020-02-03 LAB — D-DIMER, QUANTITATIVE: D-Dimer, Quant: 0.27 ug/mL-FEU (ref 0.00–0.50)

## 2020-02-03 LAB — MAGNESIUM: Magnesium: 2 mg/dL (ref 1.7–2.4)

## 2020-02-03 NOTE — Progress Notes (Signed)
   Subjective:    Patient ID: Mark Curry, male    DOB: [birth year], 34 y.o.   MRN: [id number]  HPI Pt here for cramping in legs, persistent pain. Left leg from knee to calf. Pt was hospitalized in [place] over the weekend and continually had cramps. Leg not warm or tender to touch.  Patient denies any chest tightness pressure pain shortness of breath wheezing or difficulty breathing Patient was in the hospital due to thrombocytopenia, ITP Review of Systems Please see above    Objective:   Physical Exam  Lungs clear respiratory rate normal heart is regular extremities no edema calf circumference is the same bilateral no tenderness      Assessment & Plan:  Given hospital stay, given his diagnosis and also pain in the left calf need to rule out the possibility of DVT.  Lab work and ultrasound ordered stat  Should be noted that the ultrasound came back negative D-dimer negative Platelets rising Follow-up with hematology they are supposed to call him

## 2020-02-03 NOTE — Telephone Encounter (Signed)
Nurses Although the likelihood blood clot is low unfortunately this is not something we cannot totally exclude by a computer message  Connect with the patient.  Ask him to be here to be between 1 PM and 130 if possible

## 2020-02-03 NOTE — Telephone Encounter (Signed)
Patient notified and will be here around 1:15.

## 2020-02-04 ENCOUNTER — Telehealth: Payer: Self-pay | Admitting: Oncology

## 2020-02-04 ENCOUNTER — Inpatient Hospital Stay: Payer: BC Managed Care – PPO | Attending: Oncology

## 2020-02-04 ENCOUNTER — Other Ambulatory Visit: Payer: Self-pay

## 2020-02-04 DIAGNOSIS — R748 Abnormal levels of other serum enzymes: Secondary | ICD-10-CM | POA: Diagnosis not present

## 2020-02-04 DIAGNOSIS — D6959 Other secondary thrombocytopenia: Secondary | ICD-10-CM | POA: Diagnosis not present

## 2020-02-04 DIAGNOSIS — D696 Thrombocytopenia, unspecified: Secondary | ICD-10-CM | POA: Diagnosis not present

## 2020-02-04 DIAGNOSIS — D693 Immune thrombocytopenic purpura: Secondary | ICD-10-CM | POA: Diagnosis not present

## 2020-02-04 LAB — CBC WITH DIFFERENTIAL (CANCER CENTER ONLY)
Abs Immature Granulocytes: 0.04 10*3/uL (ref 0.00–0.07)
Basophils Absolute: 0.1 10*3/uL (ref 0.0–0.1)
Basophils Relative: 1 %
Eosinophils Absolute: 0.1 10*3/uL (ref 0.0–0.5)
Eosinophils Relative: 1 %
HCT: 47.3 % (ref 39.0–52.0)
Hemoglobin: 15.6 g/dL (ref 13.0–17.0)
Immature Granulocytes: 0 %
Lymphocytes Relative: 23 %
Lymphs Abs: 2.3 10*3/uL (ref 0.7–4.0)
MCH: 28.3 pg (ref 26.0–34.0)
MCHC: 33 g/dL (ref 30.0–36.0)
MCV: 85.8 fL (ref 80.0–100.0)
Monocytes Absolute: 0.6 10*3/uL (ref 0.1–1.0)
Monocytes Relative: 6 %
Neutro Abs: 7 10*3/uL (ref 1.7–7.7)
Neutrophils Relative %: 69 %
Platelet Count: 126 10*3/uL — ABNORMAL LOW (ref 150–400)
RBC: 5.51 MIL/uL (ref 4.22–5.81)
RDW: 12.8 % (ref 11.5–15.5)
WBC Count: 10 10*3/uL (ref 4.0–10.5)
nRBC: 0 % (ref 0.0–0.2)

## 2020-02-04 NOTE — Telephone Encounter (Signed)
Scheduled appt per 6/19 sch message - pt is aware of appts added.

## 2020-02-10 ENCOUNTER — Other Ambulatory Visit: Payer: Self-pay | Admitting: *Deleted

## 2020-02-10 DIAGNOSIS — R748 Abnormal levels of other serum enzymes: Secondary | ICD-10-CM

## 2020-02-10 DIAGNOSIS — D696 Thrombocytopenia, unspecified: Secondary | ICD-10-CM

## 2020-02-10 DIAGNOSIS — J3089 Other allergic rhinitis: Secondary | ICD-10-CM

## 2020-02-10 DIAGNOSIS — D693 Immune thrombocytopenic purpura: Secondary | ICD-10-CM

## 2020-02-10 DIAGNOSIS — K760 Fatty (change of) liver, not elsewhere classified: Secondary | ICD-10-CM

## 2020-02-10 DIAGNOSIS — R430 Anosmia: Secondary | ICD-10-CM

## 2020-02-10 MED ORDER — PREDNISONE 20 MG PO TABS: ORAL_TABLET | ORAL | 2 refills | Status: DC

## 2020-02-11 ENCOUNTER — Other Ambulatory Visit: Payer: Self-pay

## 2020-02-11 ENCOUNTER — Inpatient Hospital Stay: Payer: BC Managed Care – PPO

## 2020-02-11 DIAGNOSIS — D693 Immune thrombocytopenic purpura: Secondary | ICD-10-CM

## 2020-02-11 LAB — CBC WITH DIFFERENTIAL (CANCER CENTER ONLY)
Abs Immature Granulocytes: 0.11 10*3/uL — ABNORMAL HIGH (ref 0.00–0.07)
Basophils Absolute: 0.1 10*3/uL (ref 0.0–0.1)
Basophils Relative: 1 %
Eosinophils Absolute: 0.2 10*3/uL (ref 0.0–0.5)
Eosinophils Relative: 3 %
HCT: 50.9 % (ref 39.0–52.0)
Hemoglobin: 16.8 g/dL (ref 13.0–17.0)
Immature Granulocytes: 1 %
Lymphocytes Relative: 29 %
Lymphs Abs: 2.6 10*3/uL (ref 0.7–4.0)
MCH: 28.1 pg (ref 26.0–34.0)
MCHC: 33 g/dL (ref 30.0–36.0)
MCV: 85.1 fL (ref 80.0–100.0)
Monocytes Absolute: 0.7 10*3/uL (ref 0.1–1.0)
Monocytes Relative: 7 %
Neutro Abs: 5.4 10*3/uL (ref 1.7–7.7)
Neutrophils Relative %: 59 %
Platelet Count: 173 10*3/uL (ref 150–400)
RBC: 5.98 MIL/uL — ABNORMAL HIGH (ref 4.22–5.81)
RDW: 12.9 % (ref 11.5–15.5)
WBC Count: 9.1 10*3/uL (ref 4.0–10.5)
nRBC: 0 % (ref 0.0–0.2)

## 2020-02-16 ENCOUNTER — Encounter: Payer: Self-pay | Admitting: Family Medicine

## 2020-02-18 ENCOUNTER — Other Ambulatory Visit: Payer: Self-pay | Admitting: Oncology

## 2020-02-18 ENCOUNTER — Inpatient Hospital Stay: Payer: BC Managed Care – PPO | Attending: Oncology

## 2020-02-18 ENCOUNTER — Other Ambulatory Visit: Payer: Self-pay

## 2020-02-18 ENCOUNTER — Telehealth: Payer: Self-pay | Admitting: *Deleted

## 2020-02-18 DIAGNOSIS — D693 Immune thrombocytopenic purpura: Secondary | ICD-10-CM | POA: Insufficient documentation

## 2020-02-18 DIAGNOSIS — D696 Thrombocytopenia, unspecified: Secondary | ICD-10-CM

## 2020-02-18 DIAGNOSIS — Z7952 Long term (current) use of systemic steroids: Secondary | ICD-10-CM | POA: Diagnosis not present

## 2020-02-18 LAB — CBC WITH DIFFERENTIAL/PLATELET
Abs Immature Granulocytes: 0.1 10*3/uL — ABNORMAL HIGH (ref 0.00–0.07)
Basophils Absolute: 0 10*3/uL (ref 0.0–0.1)
Basophils Relative: 0 %
Eosinophils Absolute: 0.1 10*3/uL (ref 0.0–0.5)
Eosinophils Relative: 1 %
HCT: 48.6 % (ref 39.0–52.0)
Hemoglobin: 16.1 g/dL (ref 13.0–17.0)
Immature Granulocytes: 1 %
Lymphocytes Relative: 13 %
Lymphs Abs: 1.7 10*3/uL (ref 0.7–4.0)
MCH: 28.1 pg (ref 26.0–34.0)
MCHC: 33.1 g/dL (ref 30.0–36.0)
MCV: 84.8 fL (ref 80.0–100.0)
Monocytes Absolute: 0.7 10*3/uL (ref 0.1–1.0)
Monocytes Relative: 6 %
Neutro Abs: 10 10*3/uL — ABNORMAL HIGH (ref 1.7–7.7)
Neutrophils Relative %: 79 %
Platelets: 191 10*3/uL (ref 150–400)
RBC: 5.73 MIL/uL (ref 4.22–5.81)
RDW: 12.8 % (ref 11.5–15.5)
WBC: 12.6 10*3/uL — ABNORMAL HIGH (ref 4.0–10.5)
nRBC: 0 % (ref 0.0–0.2)

## 2020-02-18 NOTE — Telephone Encounter (Signed)
This RN spoke with pt per lab today with platelet count of 191,000.   He was on 50 mg prednisone and decreased to 40 mg this am.  No other needs at this time.  Pt has scheduled appointments including MD follow up on 03/10/2020.

## 2020-02-25 ENCOUNTER — Other Ambulatory Visit: Payer: Self-pay

## 2020-02-25 ENCOUNTER — Inpatient Hospital Stay: Payer: BC Managed Care – PPO

## 2020-02-25 DIAGNOSIS — D696 Thrombocytopenia, unspecified: Secondary | ICD-10-CM

## 2020-02-25 DIAGNOSIS — D693 Immune thrombocytopenic purpura: Secondary | ICD-10-CM | POA: Diagnosis not present

## 2020-02-25 LAB — CBC WITH DIFFERENTIAL/PLATELET
Abs Immature Granulocytes: 0.07 10*3/uL (ref 0.00–0.07)
Basophils Absolute: 0.1 10*3/uL (ref 0.0–0.1)
Basophils Relative: 1 %
Eosinophils Absolute: 0.2 10*3/uL (ref 0.0–0.5)
Eosinophils Relative: 1 %
HCT: 51.2 % (ref 39.0–52.0)
Hemoglobin: 17 g/dL (ref 13.0–17.0)
Immature Granulocytes: 1 %
Lymphocytes Relative: 25 %
Lymphs Abs: 2.9 10*3/uL (ref 0.7–4.0)
MCH: 28.7 pg (ref 26.0–34.0)
MCHC: 33.2 g/dL (ref 30.0–36.0)
MCV: 86.3 fL (ref 80.0–100.0)
Monocytes Absolute: 0.5 10*3/uL (ref 0.1–1.0)
Monocytes Relative: 4 %
Neutro Abs: 8 10*3/uL — ABNORMAL HIGH (ref 1.7–7.7)
Neutrophils Relative %: 68 %
Platelets: 157 10*3/uL (ref 150–400)
RBC: 5.93 MIL/uL — ABNORMAL HIGH (ref 4.22–5.81)
RDW: 12.8 % (ref 11.5–15.5)
WBC: 11.7 10*3/uL — ABNORMAL HIGH (ref 4.0–10.5)
nRBC: 0 % (ref 0.0–0.2)

## 2020-03-03 ENCOUNTER — Telehealth: Payer: Self-pay | Admitting: Oncology

## 2020-03-03 ENCOUNTER — Inpatient Hospital Stay: Payer: BC Managed Care – PPO

## 2020-03-03 ENCOUNTER — Other Ambulatory Visit: Payer: Self-pay

## 2020-03-03 DIAGNOSIS — D693 Immune thrombocytopenic purpura: Secondary | ICD-10-CM | POA: Diagnosis not present

## 2020-03-03 DIAGNOSIS — D696 Thrombocytopenia, unspecified: Secondary | ICD-10-CM

## 2020-03-03 LAB — CBC WITH DIFFERENTIAL/PLATELET
Abs Immature Granulocytes: 0.05 10*3/uL (ref 0.00–0.07)
Basophils Absolute: 0 10*3/uL (ref 0.0–0.1)
Basophils Relative: 1 %
Eosinophils Absolute: 0.2 10*3/uL (ref 0.0–0.5)
Eosinophils Relative: 2 %
HCT: 48.7 % (ref 39.0–52.0)
Hemoglobin: 16.6 g/dL (ref 13.0–17.0)
Immature Granulocytes: 1 %
Lymphocytes Relative: 20 %
Lymphs Abs: 1.7 10*3/uL (ref 0.7–4.0)
MCH: 29.4 pg (ref 26.0–34.0)
MCHC: 34.1 g/dL (ref 30.0–36.0)
MCV: 86.3 fL (ref 80.0–100.0)
Monocytes Absolute: 0.4 10*3/uL (ref 0.1–1.0)
Monocytes Relative: 5 %
Neutro Abs: 6 10*3/uL (ref 1.7–7.7)
Neutrophils Relative %: 71 %
Platelets: 178 10*3/uL (ref 150–400)
RBC: 5.64 MIL/uL (ref 4.22–5.81)
RDW: 12.9 % (ref 11.5–15.5)
WBC: 8.3 10*3/uL (ref 4.0–10.5)
nRBC: 0 % (ref 0.0–0.2)

## 2020-03-03 NOTE — Telephone Encounter (Signed)
R/s appt per 7/20 sch msg - pt is aware of appt date and time

## 2020-03-10 ENCOUNTER — Other Ambulatory Visit: Payer: Self-pay

## 2020-03-10 ENCOUNTER — Inpatient Hospital Stay (HOSPITAL_BASED_OUTPATIENT_CLINIC_OR_DEPARTMENT_OTHER): Payer: BC Managed Care – PPO | Admitting: Oncology

## 2020-03-10 ENCOUNTER — Other Ambulatory Visit: Payer: BC Managed Care – PPO

## 2020-03-10 ENCOUNTER — Ambulatory Visit: Payer: BC Managed Care – PPO | Admitting: Oncology

## 2020-03-10 ENCOUNTER — Inpatient Hospital Stay: Payer: BC Managed Care – PPO

## 2020-03-10 VITALS — BP 135/74 | HR 82 | Temp 98.5°F | Resp 20 | Ht 72.0 in | Wt 287.3 lb

## 2020-03-10 DIAGNOSIS — D696 Thrombocytopenia, unspecified: Secondary | ICD-10-CM | POA: Diagnosis not present

## 2020-03-10 DIAGNOSIS — D693 Immune thrombocytopenic purpura: Secondary | ICD-10-CM

## 2020-03-10 DIAGNOSIS — K760 Fatty (change of) liver, not elsewhere classified: Secondary | ICD-10-CM | POA: Diagnosis not present

## 2020-03-10 DIAGNOSIS — D6959 Other secondary thrombocytopenia: Secondary | ICD-10-CM | POA: Diagnosis not present

## 2020-03-10 DIAGNOSIS — E669 Obesity, unspecified: Secondary | ICD-10-CM | POA: Insufficient documentation

## 2020-03-10 LAB — CBC WITH DIFFERENTIAL/PLATELET
Abs Immature Granulocytes: 0.04 10*3/uL (ref 0.00–0.07)
Basophils Absolute: 0 10*3/uL (ref 0.0–0.1)
Basophils Relative: 0 %
Eosinophils Absolute: 0.1 10*3/uL (ref 0.0–0.5)
Eosinophils Relative: 1 %
HCT: 50.3 % (ref 39.0–52.0)
Hemoglobin: 16.7 g/dL (ref 13.0–17.0)
Immature Granulocytes: 0 %
Lymphocytes Relative: 15 %
Lymphs Abs: 1.3 10*3/uL (ref 0.7–4.0)
MCH: 28.2 pg (ref 26.0–34.0)
MCHC: 33.2 g/dL (ref 30.0–36.0)
MCV: 84.8 fL (ref 80.0–100.0)
Monocytes Absolute: 0.3 10*3/uL (ref 0.1–1.0)
Monocytes Relative: 4 %
Neutro Abs: 7.2 10*3/uL (ref 1.7–7.7)
Neutrophils Relative %: 80 %
Platelets: 234 10*3/uL (ref 150–400)
RBC: 5.93 MIL/uL — ABNORMAL HIGH (ref 4.22–5.81)
RDW: 12.8 % (ref 11.5–15.5)
WBC: 9.1 10*3/uL (ref 4.0–10.5)
nRBC: 0 % (ref 0.0–0.2)

## 2020-03-10 NOTE — Progress Notes (Signed)
[facility]  Telephone:(336) [phone] Fax:([phone]     ID: [name] DOB: 05-Jan-1986  MR#: [id number]  IWL#:[id number]  Patient Care Team: Babs Sciara, MD as PCP - General (Family Medicine) Jonelle Sidle, MD as PCP - Cardiology (Cardiology) Lowella Dell, MD OTHER MD:  CHIEF COMPLAINT: thrombocytopenia/  CURRENT TREATMENT: prednisone   INTERVAL HISTORY: Rollins returns today for follow up of his thrombocytopenia, accompaniedby by huis mother Kendal Hymen. He was started on 60 mg prednisone daily for his ITP and quickly regained a nromal platelet cunt. He t tapered down to 20 mg/d w/o difficulty. He is here today to discuss further tapering to off.   by REVIEW OF SYSTEMS: Andres has side effects from the prednisone including weight gain, inability to sleep, fatigue, hunger pains, and some palpitations.  He has not had problems with thrush or peripheral edema.  He has been able to continue to work right through the summer.  A detailed review of systems today was otherwise stable   HISTORY OF CURRENT ILLNESS: From the original intake note:   Mr Brandenberger has no prior history of blood problems and specifically his platelet counts have been normal until 01/31/2020, when he presented with petechiae.  Results for JVION, TURGEON (MRN [id number]) as of 02/01/2020 14:33  Ref. Range 09/01/2017 08:17 09/19/2019 11:44 01/18/2020 17:14 01/31/2020 12:06 02/01/2020 11:08  Platelets Latest Ref Range: 150 - 400 K/uL 199 200 197 6 (LL) 16 (LL)   He had a viral-like illness in the past 2 weeks, with N/V/D documented 01/18/2020 in the ED. At that time KUB was unremarkable.  Today 02/01/2020 he was directed to the ED by his primary care MD and here his counts read out at 16K.  Aside from the petechiae he has had no bleeding or bruising. Prior dental extractions uncomplicated.  The patient's subsequent history is as detailed below.   PAST MEDICAL HISTORY: Past Medical History:    Diagnosis Date  . No pertinent past medical history   seasonal allergies; history of "chest pressure" worked up by cardiology with no cardiac cause established (per pateint: records not in Epic)   PAST SURGICAL HISTORY: Past Surgical History:  Procedure Laterality Date  . TONSILLECTOMY    s/p wisdom teeth extraction   FAMILY HISTORY Family History  Problem Relation Age of Onset  . Polycystic ovary syndrome Mother   . Diabetes Father   . Insulin resistance Sister   . Multiple sclerosis Maternal Grandmother   . Allergic rhinitis Brother   . Angioedema Neg Hx   . Asthma Neg Hx   . Eczema Neg Hx   . Immunodeficiency Neg Hx   . 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 Wife Baxter Hire also works at [employer], "with international students." They have a 63 year old child. The patient attends [employer] in [place]    ADVANCED DIRECTIVES: in the absenc eof any documents to the contrary the patient's spouse is his HCPOA   HEALTH MAINTENANCE: Social History   Tobacco Use  . Smoking status: Never Smoker  . Smokeless tobacco: Never Used  Vaping Use  . Vaping Use: Never used  Substance Use Topics  . Alcohol use: Not Currently  . Drug use:  Never       No Known Allergies  Current Outpatient Medications  Medication Sig Dispense Refill  . predniSONE (DELTASONE) 20 MG tablet Pt on high dose taper for ITP current dose is 50 mg daily 75 tablet 2   No current facility-administered medications for this visit.    OBJECTIVE: White man in no acute distress  Vitals:   03/10/20 1609  BP: (!) 135/74  Pulse: 82  Resp: 20  Temp: 98.5 F (36.9 C)  SpO2: 98%     Body mass index is 38.96 kg/m.   Wt Readings from Last 3  Encounters:  03/10/20 (!) 287 lb 4.8 oz (130.3 kg)  02/03/20 284 lb 6.4 oz (129 kg)  02/01/20 278 lb (126.1 kg)       ECOG FS:1 - Symptomatic but completely ambulatory  Sclerae unicteric, EOMs intact Wearing a mask No cervical or supraclavicular adenopathy Lungs no rales or rhonchi Heart regular rate and rhythm Abd soft, nontender, positive bowel sounds, no splenomegaly MSK no focal spinal tenderness, no upper extremity lymphedema Neuro: nonfocal, well oriented, appropriate affect   LAB RESULTS:  CMP     Component Value Date/Time   NA 137 02/03/2020 1546   NA 140 09/01/2017 0817   K 4.0 02/03/2020 1546   CL 104 02/03/2020 1546   CO2 23 02/03/2020 1546   GLUCOSE 128 (H) 02/03/2020 1546   BUN 13 02/03/2020 1546   BUN 11 09/01/2017 0817   CREATININE 0.98 02/03/2020 1546   CALCIUM 9.0 02/03/2020 1546   PROT 8.0 01/18/2020 1714   PROT 7.3 09/20/2019 0937   ALBUMIN 4.7 01/18/2020 1714   ALBUMIN 4.7 09/20/2019 0937   AST 31 01/18/2020 1714   ALT 42 01/18/2020 1714   ALKPHOS 63 01/18/2020 1714   BILITOT 1.2 01/18/2020 1714   BILITOT 0.7 09/20/2019 0937   GFRNONAA >60 02/03/2020 1546   GFRAA >60 02/03/2020 1546    No results found for: TOTALPROTELP, ALBUMINELP, A1GS, A2GS, BETS, BETA2SER, GAMS, MSPIKE, SPEI  No results found for: KPAFRELGTCHN, LAMBDASER, KAPLAMBRATIO  Lab Results  Component Value Date   WBC 9.1 03/10/2020   NEUTROABS 7.2 03/10/2020   HGB 16.7 03/10/2020   HCT 50.3 03/10/2020   MCV 84.8 03/10/2020   PLT 234 03/10/2020   No results found for: LABCA2  No components found for: [id number]  No results for input(s): INR in the last 168 hours.  No results found for: LABCA2  No results found for: [id number]  No results found for: NFA213CAN125  No results found for: [id number]  No results found for: CA2729  No components found for: HGQUANT  No results found for: CEA1 / No results found for: CEA1   No results found for: AFPTUMOR  No results found for:  CHROMOGRNA  No results found for: PSA1  Appointment on 03/10/2020  Component Date Value Ref Range Status  . WBC 03/10/2020 9.1  4.0 - 10.5 K/uL Final  . RBC 03/10/2020 5.93* 4.22 - 5.81 MIL/uL Final  . Hemoglobin 03/10/2020 16.7  13.0 - 17.0 g/dL Final  . HCT 46/96/295207/27/2021 50.3  39 - 52 % Final  . MCV 03/10/2020 84.8  80.0 - 100.0 fL Final  . MCH 03/10/2020 28.2  26.0 - 34.0 pg Final  . MCHC 03/10/2020 33.2  30.0 - 36.0 g/dL Final  . RDW 84/13/244007/27/2021 12.8  11.5 - 15.5 % Final  . Platelets 03/10/2020 234  150 - 400 K/uL Final  . nRBC 03/10/2020 0.0  0.0 - 0.2 % Final  .  Neutrophils Relative % 03/10/2020 80  % Final  . Neutro Abs 03/10/2020 7.2  1.7 - 7.7 K/uL Final  . Lymphocytes Relative 03/10/2020 15  % Final  . Lymphs Abs 03/10/2020 1.3  0.7 - 4.0 K/uL Final  . Monocytes Relative 03/10/2020 4  % Final  . Monocytes Absolute 03/10/2020 0.3  0 - 1 K/uL Final  . Eosinophils Relative 03/10/2020 1  % Final  . Eosinophils Absolute 03/10/2020 0.1  0 - 0 K/uL Final  . Basophils Relative 03/10/2020 0  % Final  . Basophils Absolute 03/10/2020 0.0  0 - 0 K/uL Final  . Immature Granulocytes 03/10/2020 0  % Final  . Abs Immature Granulocytes 03/10/2020 0.04  0.00 - 0.07 K/uL Final   Performed at [facility], [address] [place], [place] [address]    (this displays the last labs from the last 3 days)  No results found for: TOTALPROTELP, ALBUMINELP, A1GS, A2GS, BETS, BETA2SER, GAMS, MSPIKE, SPEI (this displays SPEP labs)  No results found for: KPAFRELGTCHN, LAMBDASER, KAPLAMBRATIO (kappa/lambda light chains)  No results found for: HGBA, HGBA2QUANT, HGBFQUANT, HGBSQUAN (Hemoglobinopathy evaluation)   No results found for: LDH  No results found for: IRON, TIBC, IRONPCTSAT (Iron and TIBC)  No results found for: FERRITIN  Urinalysis    Component Value Date/Time   COLORURINE YELLOW 01/18/2020 1638   APPEARANCEUR CLEAR 01/18/2020 1638   LABSPEC 1.021  01/18/2020 1638   PHURINE 6.0 01/18/2020 1638   GLUCOSEU NEGATIVE 01/18/2020 1638   HGBUR NEGATIVE 01/18/2020 1638   BILIRUBINUR NEGATIVE 01/18/2020 1638   KETONESUR NEGATIVE 01/18/2020 1638   PROTEINUR NEGATIVE 01/18/2020 1638   UROBILINOGEN 0.2 01/17/2009 2213   NITRITE NEGATIVE 01/18/2020 1638   LEUKOCYTESUR NEGATIVE 01/18/2020 1638    STUDIES: No results found.   ELIGIBLE FOR AVAILABLE RESEARCH PROTOCOL: no  ASSESSMENT: 34 y.o. [place] man presenting with petechiae, found to be profoundly thrombocytopenic, working diagnosis immune Thrombocytopenic purpura (ITP)  (a) prednisone started 02/01/2020 at 60 mg po daily   PLAN: Pursifull has mated down to 20 mg daily on the prednisone without any recurrence of his ITP.  This is favorable.  He understands at from this point we have to go more slowly.  He will take 10 mg in the morning and 5 mg in the evening beginning tomorrow and continuing through August 10.  On August 11 he will start 10 mg in the morning and continue through August 23.  On August 24 he will start 5 mg in the morning and take that for 2 weeks.  On September 7 we will start 5 mg every other day and then stop.  He understands that his slow taper will optimize his chance of cure and allow his adrenal glands to slowly recover function  "Aside from this he and his family are very concerned regarding obesity and fatty liver/cirrhosis issues.  They have had significant significant experience with this since one of his aunts had to have a liver transplant for this reason.  Today we discussed a no carb diet which is admittedly very difficult to keep.  He is already exercising regularly and I encouraged him to continue and if possible intensify that.  We also discussed manipulation of the gut flora and they are going to continue to explore that possibility  He will see me at the end of his taper, when he goes off in September and at that point I will set him up for labs  every 3 months  the first year, every 4 months the next and every 6 months the third year after which she will be having labs see her on a once a year basis  Total encounter time 35 minutes.Lowella Dell, MD   03/10/2020 5:35 PM Medical Oncology and Hematology [facility] [address] [place], [place] [address] Tel. [phone]    Fax. [phone]   I, Mickie Bail, am acting as scribe for Dr. Valentino Hue. Gurjit Loconte.  I, Ruthann Cancer MD, have reviewed the above documentation for accuracy and completeness, and I agree with the above.    *Total Encounter Time as defined by the Centers for Medicare and Medicaid Services includes, in addition to the face-to-face time of a patient visit (documented in the note above) non-face-to-face time: obtaining and reviewing outside history, ordering and reviewing medications, tests or procedures, care coordination (communications with other health care professionals or caregivers) and documentation in the medical record.

## 2020-03-16 ENCOUNTER — Other Ambulatory Visit: Payer: Self-pay | Admitting: *Deleted

## 2020-03-16 ENCOUNTER — Telehealth: Payer: Self-pay | Admitting: Adult Health

## 2020-03-16 DIAGNOSIS — D696 Thrombocytopenia, unspecified: Secondary | ICD-10-CM

## 2020-03-16 MED ORDER — PREDNISONE 5 MG PO TABS: 10.0000 mg | ORAL_TABLET | Freq: Every day | ORAL | 2 refills | Status: DC

## 2020-03-16 MED ORDER — PREDNISONE 5 MG PO TABS: 5.0000 mg | ORAL_TABLET | Freq: Every day | ORAL | 2 refills | Status: DC

## 2020-03-16 MED ORDER — PREDNISONE 20 MG PO TABS: 10.0000 mg | ORAL_TABLET | Freq: Every day | ORAL | Status: DC

## 2020-03-16 NOTE — Telephone Encounter (Signed)
Cancelled appts and scheduled appts per 7/29 staff msg. Pt confirmed new appt dates and times.

## 2020-03-17 ENCOUNTER — Inpatient Hospital Stay: Payer: BC Managed Care – PPO

## 2020-03-24 ENCOUNTER — Inpatient Hospital Stay: Payer: BC Managed Care – PPO | Attending: Adult Health

## 2020-03-24 ENCOUNTER — Other Ambulatory Visit: Payer: Self-pay

## 2020-03-24 DIAGNOSIS — D693 Immune thrombocytopenic purpura: Secondary | ICD-10-CM | POA: Diagnosis not present

## 2020-03-24 LAB — CBC WITH DIFFERENTIAL (CANCER CENTER ONLY)
Abs Immature Granulocytes: 0.03 10*3/uL (ref 0.00–0.07)
Basophils Absolute: 0.1 10*3/uL (ref 0.0–0.1)
Basophils Relative: 1 %
Eosinophils Absolute: 0.2 10*3/uL (ref 0.0–0.5)
Eosinophils Relative: 2 %
HCT: 47.7 % (ref 39.0–52.0)
Hemoglobin: 15.8 g/dL (ref 13.0–17.0)
Immature Granulocytes: 0 %
Lymphocytes Relative: 27 %
Lymphs Abs: 2.3 10*3/uL (ref 0.7–4.0)
MCH: 28.3 pg (ref 26.0–34.0)
MCHC: 33.1 g/dL (ref 30.0–36.0)
MCV: 85.3 fL (ref 80.0–100.0)
Monocytes Absolute: 0.6 10*3/uL (ref 0.1–1.0)
Monocytes Relative: 7 %
Neutro Abs: 5.4 10*3/uL (ref 1.7–7.7)
Neutrophils Relative %: 63 %
Platelet Count: 193 10*3/uL (ref 150–400)
RBC: 5.59 MIL/uL (ref 4.22–5.81)
RDW: 13.1 % (ref 11.5–15.5)
WBC Count: 8.5 10*3/uL (ref 4.0–10.5)
nRBC: 0 % (ref 0.0–0.2)

## 2020-03-31 ENCOUNTER — Other Ambulatory Visit: Payer: BC Managed Care – PPO

## 2020-04-07 ENCOUNTER — Other Ambulatory Visit: Payer: Self-pay

## 2020-04-07 ENCOUNTER — Inpatient Hospital Stay: Payer: BC Managed Care – PPO

## 2020-04-07 DIAGNOSIS — D693 Immune thrombocytopenic purpura: Secondary | ICD-10-CM

## 2020-04-07 LAB — CBC WITH DIFFERENTIAL (CANCER CENTER ONLY)
Abs Immature Granulocytes: 0.02 10*3/uL (ref 0.00–0.07)
Basophils Absolute: 0.1 10*3/uL (ref 0.0–0.1)
Basophils Relative: 1 %
Eosinophils Absolute: 0.2 10*3/uL (ref 0.0–0.5)
Eosinophils Relative: 3 %
HCT: 46.5 % (ref 39.0–52.0)
Hemoglobin: 15.3 g/dL (ref 13.0–17.0)
Immature Granulocytes: 0 %
Lymphocytes Relative: 23 %
Lymphs Abs: 1.7 10*3/uL (ref 0.7–4.0)
MCH: 28.6 pg (ref 26.0–34.0)
MCHC: 32.9 g/dL (ref 30.0–36.0)
MCV: 86.9 fL (ref 80.0–100.0)
Monocytes Absolute: 0.5 10*3/uL (ref 0.1–1.0)
Monocytes Relative: 6 %
Neutro Abs: 5.1 10*3/uL (ref 1.7–7.7)
Neutrophils Relative %: 67 %
Platelet Count: 195 10*3/uL (ref 150–400)
RBC: 5.35 MIL/uL (ref 4.22–5.81)
RDW: 13.2 % (ref 11.5–15.5)
WBC Count: 7.6 10*3/uL (ref 4.0–10.5)
nRBC: 0 % (ref 0.0–0.2)

## 2020-04-14 ENCOUNTER — Other Ambulatory Visit: Payer: BC Managed Care – PPO

## 2020-04-21 ENCOUNTER — Other Ambulatory Visit: Payer: Self-pay

## 2020-04-21 ENCOUNTER — Inpatient Hospital Stay: Payer: BC Managed Care – PPO | Attending: Adult Health

## 2020-04-21 DIAGNOSIS — D693 Immune thrombocytopenic purpura: Secondary | ICD-10-CM | POA: Diagnosis not present

## 2020-04-21 LAB — CBC WITH DIFFERENTIAL (CANCER CENTER ONLY)
Abs Immature Granulocytes: 0.01 10*3/uL (ref 0.00–0.07)
Basophils Absolute: 0 10*3/uL (ref 0.0–0.1)
Basophils Relative: 1 %
Eosinophils Absolute: 0.2 10*3/uL (ref 0.0–0.5)
Eosinophils Relative: 4 %
HCT: 48.4 % (ref 39.0–52.0)
Hemoglobin: 16.1 g/dL (ref 13.0–17.0)
Immature Granulocytes: 0 %
Lymphocytes Relative: 23 %
Lymphs Abs: 1.6 10*3/uL (ref 0.7–4.0)
MCH: 28.9 pg (ref 26.0–34.0)
MCHC: 33.3 g/dL (ref 30.0–36.0)
MCV: 86.9 fL (ref 80.0–100.0)
Monocytes Absolute: 0.4 10*3/uL (ref 0.1–1.0)
Monocytes Relative: 5 %
Neutro Abs: 4.6 10*3/uL (ref 1.7–7.7)
Neutrophils Relative %: 67 %
Platelet Count: 215 10*3/uL (ref 150–400)
RBC: 5.57 MIL/uL (ref 4.22–5.81)
RDW: 13.4 % (ref 11.5–15.5)
WBC Count: 6.9 10*3/uL (ref 4.0–10.5)
nRBC: 0 % (ref 0.0–0.2)

## 2020-05-05 NOTE — Progress Notes (Signed)
[facility]  Telephone:([phone] Fax:(336) [phone]     ID: Mark Curry DOB: 1985/09/10  Mark#: [id number]  ZTI#:[id number]  Patient Care Team: Babs Sciara, MD as PCP - General (Family Medicine) Jonelle Sidle, MD as PCP - Cardiology (Cardiology) Noreene Filbert, NP OTHER MD:  CHIEF COMPLAINT: thrombocytopenia/  CURRENT TREATMENT: prednisone   INTERVAL HISTORY: Mark Curry returns today for follow up of his thrombocytopenia, unaccompanied.  He has had no issues since tapering of the prednisone.  He has been off for a couple of weeks and is feeling well other than some fatigue.  REVIEW OF SYSTEMS: Mark Curry is feeling well.  He has had no easy bruising/bleeding.  He denies any fever, chills, chest pain, palpitations, cough, shortness of breath, headaches, bowel/bladder changes, nausea, or vomiting, or any other concerns.  A detailed ROS was otherwise non contributory.     HISTORY OF CURRENT ILLNESS: From the original intake note:   Mark Curry has no prior history of blood problems and specifically his platelet counts have been normal until 01/31/2020, when he presented with petechiae.  Results for KHADAR, MONGER (MRN [id number]) as of 02/01/2020 14:33  Ref. Range 09/01/2017 08:17 09/19/2019 11:44 01/18/2020 17:14 01/31/2020 12:06 02/01/2020 11:08  Platelets Latest Ref Range: 150 - 400 K/uL 199 200 197 6 (LL) 16 (LL)   He had a viral-like illness in the past 2 weeks, with N/V/D documented 01/18/2020 in the ED. At that time KUB was unremarkable.  Today 02/01/2020 he was directed to the ED by his primary care MD and here his counts read out at 16K.  Aside from the petechiae he has had no bleeding or bruising. Prior dental extractions uncomplicated.  The patient's subsequent history is as detailed below.   PAST MEDICAL HISTORY: Past Medical History:  Diagnosis Date  . No pertinent past medical history   seasonal allergies; history of "chest pressure" worked up by  cardiology with no cardiac cause established (per pateint: records not in Epic)   PAST SURGICAL HISTORY: Past Surgical History:  Procedure Laterality Date  . TONSILLECTOMY    s/p wisdom teeth extraction   FAMILY HISTORY Family History  Problem Relation Age of Onset  . Polycystic ovary syndrome Mother   . Diabetes Father   . Insulin resistance Sister   . Multiple sclerosis Maternal Grandmother   . Allergic rhinitis Brother   . Angioedema Neg Hx   . Asthma Neg Hx   . Eczema Neg Hx   . Immunodeficiency Neg Hx   . 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 Wife Baxter Hire also works at [employer], "with international students." They have a 34 year old child. The patient attends [employer] in [place]    ADVANCED DIRECTIVES: in the absenc eof any documents to the contrary the patient's spouse is his HCPOA   HEALTH MAINTENANCE: Social History   Tobacco Use  . Smoking status: Never Smoker  . Smokeless tobacco: Never Used  Vaping Use  . Vaping Use: Never used  Substance Use Topics  . Alcohol use: Not Currently  . Drug use: Never       No Known Allergies  No current outpatient medications on file.   No current facility-administered medications for  this visit.    OBJECTIVE:   Vitals:   05/06/20 1152  BP: 132/84  Pulse: 82  Resp: 16  Temp: (!) 97.3 F (36.3 C)  SpO2: 97%     Body mass index is 39.55 kg/m.   Wt Readings from Last 3 Encounters:  05/06/20 291 lb 9.6 oz (132.3 kg)  03/10/20 (!) 287 lb 4.8 oz (130.3 kg)  02/03/20 284 lb 6.4 oz (129 kg)       ECOG FS:1 - Symptomatic but completely ambulatory GENERAL: Patient is a well appearing male in no acute distress HEENT:  Sclerae anicteric.  Mask in  place. Neck is supple.  NODES:  No cervical, supraclavicular, or axillary lymphadenopathy palpated.  LUNGS:  Clear to auscultation bilaterally.  No wheezes or rhonchi. HEART:  Regular rate and rhythm. No murmur appreciated. ABDOMEN:  Soft, nontender.  Positive, normoactive bowel sounds. No organomegaly palpated. MSK:  No focal spinal tenderness to palpation.  EXTREMITIES:  No peripheral edema.   SKIN:  Clear with no obvious rashes or skin changes. No nail dyscrasia. NEURO:  Nonfocal. Well oriented.  Appropriate affect.     LAB RESULTS:  CMP     Component Value Date/Time   NA 137 02/03/2020 1546   NA 140 09/01/2017 0817   K 4.0 02/03/2020 1546   CL 104 02/03/2020 1546   CO2 23 02/03/2020 1546   GLUCOSE 128 (H) 02/03/2020 1546   BUN 13 02/03/2020 1546   BUN 11 09/01/2017 0817   CREATININE 0.98 02/03/2020 1546   CALCIUM 9.0 02/03/2020 1546   PROT 8.0 01/18/2020 1714   PROT 7.3 09/20/2019 0937   ALBUMIN 4.7 01/18/2020 1714   ALBUMIN 4.7 09/20/2019 0937   AST 31 01/18/2020 1714   ALT 42 01/18/2020 1714   ALKPHOS 63 01/18/2020 1714   BILITOT 1.2 01/18/2020 1714   BILITOT 0.7 09/20/2019 0937   GFRNONAA >60 02/03/2020 1546   GFRAA >60 02/03/2020 1546    No results found for: TOTALPROTELP, ALBUMINELP, A1GS, A2GS, BETS, BETA2SER, GAMS, MSPIKE, SPEI  No results found for: KPAFRELGTCHN, LAMBDASER, [facility]  Lab Results  Component Value Date   WBC 7.5 05/06/2020   NEUTROABS 4.5 05/06/2020   HGB 15.6 05/06/2020   HCT 45.2 05/06/2020   MCV 85.9 05/06/2020   PLT 208 05/06/2020   No results found for: LABCA2  No components found for: QIHKVQ259  No results for input(s): INR in the last 168 hours.  No results found for: LABCA2  No results found for: DGL875  No results found for: IEP329  No results found for: JJO841  No results found for: CA2729  No components found for: HGQUANT  No results found for: CEA1 / No results found for: CEA1   No results found for:  AFPTUMOR  No results found for: CHROMOGRNA  No results found for: PSA1  Appointment on 05/06/2020  Component Date Value Ref Range Status  . WBC Count 05/06/2020 7.5  4.0 - 10.5 K/uL Final  . RBC 05/06/2020 5.26  4.22 - 5.81 MIL/uL Final  . Hemoglobin 05/06/2020 15.6  13.0 - 17.0 g/dL Final  . HCT 66/01/3015 45.2  39 - 52 % Final  . MCV 05/06/2020 85.9  80.0 - 100.0 fL Final  . MCH 05/06/2020 29.7  26.0 - 34.0 pg Final  . MCHC 05/06/2020 34.5  30.0 - 36.0 g/dL Final  . RDW 08/23/3233 13.1  11.5 - 15.5 % Final  . Platelet Count 05/06/2020 208  150 - 400 K/uL Final  .  nRBC 05/06/2020 0.0  0.0 - 0.2 % Final  . Neutrophils Relative % 05/06/2020 60  % Final  . Neutro Abs 05/06/2020 4.5  1.7 - 7.7 K/uL Final  . Lymphocytes Relative 05/06/2020 29  % Final  . Lymphs Abs 05/06/2020 2.2  0.7 - 4.0 K/uL Final  . Monocytes Relative 05/06/2020 5  % Final  . Monocytes Absolute 05/06/2020 0.4  0 - 1 K/uL Final  . Eosinophils Relative 05/06/2020 5  % Final  . Eosinophils Absolute 05/06/2020 0.4  0 - 0 K/uL Final  . Basophils Relative 05/06/2020 1  % Final  . Basophils Absolute 05/06/2020 0.0  0 - 0 K/uL Final  . Immature Granulocytes 05/06/2020 0  % Final  . Abs Immature Granulocytes 05/06/2020 0.03  0.00 - 0.07 K/uL Final   Performed at [facility], [address] [place], [place] [address]    (this displays the last labs from the last 3 days)  No results found for: TOTALPROTELP, ALBUMINELP, A1GS, A2GS, BETS, BETA2SER, GAMS, MSPIKE, SPEI (this displays SPEP labs)  No results found for: KPAFRELGTCHN, LAMBDASER, KAPLAMBRATIO (kappa/lambda light chains)  No results found for: HGBA, HGBA2QUANT, HGBFQUANT, HGBSQUAN (Hemoglobinopathy evaluation)   No results found for: LDH  No results found for: IRON, TIBC, IRONPCTSAT (Iron and TIBC)  No results found for: FERRITIN  Urinalysis    Component Value Date/Time   COLORURINE YELLOW 01/18/2020 1638   APPEARANCEUR  CLEAR 01/18/2020 1638   LABSPEC 1.021 01/18/2020 1638   PHURINE 6.0 01/18/2020 1638   GLUCOSEU NEGATIVE 01/18/2020 1638   HGBUR NEGATIVE 01/18/2020 1638   BILIRUBINUR NEGATIVE 01/18/2020 1638   KETONESUR NEGATIVE 01/18/2020 1638   PROTEINUR NEGATIVE 01/18/2020 1638   UROBILINOGEN 0.2 01/17/2009 2213   NITRITE NEGATIVE 01/18/2020 1638   LEUKOCYTESUR NEGATIVE 01/18/2020 1638    STUDIES: No results found.   ELIGIBLE FOR AVAILABLE RESEARCH PROTOCOL: no  ASSESSMENT: 34 y.o. Mark Curry man presenting with petechiae, found to be profoundly thrombocytopenic, working diagnosis immune Thrombocytopenic purpura (ITP)  (a) prednisone started 02/01/2020 at 60 mg po daily  (b) tapered off completely in 04/2020, plt count normal   PLAN: Mark Curry is doing well today.  His cbc shows no thrombocytopenia at this point.  He has no issues with easy bruising or bleeding. His platelets are normal.    I reviewed with Mark Curry the plan will be to follow his cbc every 3 months and see him in 6 months.  He understands this.  He notes that Dr. Darnelle Catalan reviewed his blood sugars with him last time.  I let Mark Curry know that labs today showed nothing about his glucose, and his pcp will follow that at future appointments.  We briefly discussed healthy diet, exercise.  He knows to call for any questions or concerns that may arise between now and his next appointment.    Total encounter time 20 minutes.Mark Anes, NP 05/06/20 12:30 PM Medical Oncology and Hematology [facility] [address] [place], [place] [address] Tel. [phone]    Fax. [phone]     *Total Encounter Time as defined by the Centers for Medicare and Medicaid Services includes, in addition to the face-to-face time of a patient visit (documented in the note above) non-face-to-face time: obtaining and reviewing outside history, ordering and reviewing medications, tests or procedures, care coordination  (communications with other health care professionals or caregivers) and documentation in the medical record.

## 2020-05-06 ENCOUNTER — Inpatient Hospital Stay: Payer: BC Managed Care – PPO

## 2020-05-06 ENCOUNTER — Other Ambulatory Visit: Payer: Self-pay

## 2020-05-06 ENCOUNTER — Encounter: Payer: Self-pay | Admitting: Adult Health

## 2020-05-06 ENCOUNTER — Inpatient Hospital Stay: Payer: BC Managed Care – PPO | Admitting: Adult Health

## 2020-05-06 VITALS — BP 132/84 | HR 82 | Temp 97.3°F | Resp 16 | Ht 72.0 in | Wt 291.6 lb

## 2020-05-06 DIAGNOSIS — D696 Thrombocytopenia, unspecified: Secondary | ICD-10-CM | POA: Diagnosis not present

## 2020-05-06 DIAGNOSIS — D693 Immune thrombocytopenic purpura: Secondary | ICD-10-CM | POA: Diagnosis not present

## 2020-05-06 LAB — CBC WITH DIFFERENTIAL (CANCER CENTER ONLY)
Abs Immature Granulocytes: 0.03 10*3/uL (ref 0.00–0.07)
Basophils Absolute: 0 10*3/uL (ref 0.0–0.1)
Basophils Relative: 1 %
Eosinophils Absolute: 0.4 10*3/uL (ref 0.0–0.5)
Eosinophils Relative: 5 %
HCT: 45.2 % (ref 39.0–52.0)
Hemoglobin: 15.6 g/dL (ref 13.0–17.0)
Immature Granulocytes: 0 %
Lymphocytes Relative: 29 %
Lymphs Abs: 2.2 10*3/uL (ref 0.7–4.0)
MCH: 29.7 pg (ref 26.0–34.0)
MCHC: 34.5 g/dL (ref 30.0–36.0)
MCV: 85.9 fL (ref 80.0–100.0)
Monocytes Absolute: 0.4 10*3/uL (ref 0.1–1.0)
Monocytes Relative: 5 %
Neutro Abs: 4.5 10*3/uL (ref 1.7–7.7)
Neutrophils Relative %: 60 %
Platelet Count: 208 10*3/uL (ref 150–400)
RBC: 5.26 MIL/uL (ref 4.22–5.81)
RDW: 13.1 % (ref 11.5–15.5)
WBC Count: 7.5 10*3/uL (ref 4.0–10.5)
nRBC: 0 % (ref 0.0–0.2)

## 2020-08-05 ENCOUNTER — Other Ambulatory Visit: Payer: Self-pay

## 2020-08-05 ENCOUNTER — Inpatient Hospital Stay: Payer: BC Managed Care – PPO | Attending: Oncology

## 2020-08-05 DIAGNOSIS — D693 Immune thrombocytopenic purpura: Secondary | ICD-10-CM | POA: Insufficient documentation

## 2020-08-05 LAB — CBC WITH DIFFERENTIAL (CANCER CENTER ONLY)
Abs Immature Granulocytes: 0.01 10*3/uL (ref 0.00–0.07)
Basophils Absolute: 0 10*3/uL (ref 0.0–0.1)
Basophils Relative: 0 %
Eosinophils Absolute: 0.3 10*3/uL (ref 0.0–0.5)
Eosinophils Relative: 4 %
HCT: 48.6 % (ref 39.0–52.0)
Hemoglobin: 16.3 g/dL (ref 13.0–17.0)
Immature Granulocytes: 0 %
Lymphocytes Relative: 28 %
Lymphs Abs: 2.1 10*3/uL (ref 0.7–4.0)
MCH: 28.7 pg (ref 26.0–34.0)
MCHC: 33.5 g/dL (ref 30.0–36.0)
MCV: 85.6 fL (ref 80.0–100.0)
Monocytes Absolute: 0.3 10*3/uL (ref 0.1–1.0)
Monocytes Relative: 5 %
Neutro Abs: 4.6 10*3/uL (ref 1.7–7.7)
Neutrophils Relative %: 63 %
Platelet Count: 194 10*3/uL (ref 150–400)
RBC: 5.68 MIL/uL (ref 4.22–5.81)
RDW: 11.9 % (ref 11.5–15.5)
WBC Count: 7.3 10*3/uL (ref 4.0–10.5)
nRBC: 0 % (ref 0.0–0.2)

## 2020-08-17 ENCOUNTER — Ambulatory Visit (INDEPENDENT_AMBULATORY_CARE_PROVIDER_SITE_OTHER): Payer: BC Managed Care – PPO | Admitting: Family Medicine

## 2020-08-17 DIAGNOSIS — R0989 Other specified symptoms and signs involving the circulatory and respiratory systems: Secondary | ICD-10-CM

## 2020-08-17 DIAGNOSIS — B349 Viral infection, unspecified: Secondary | ICD-10-CM

## 2020-08-17 NOTE — Progress Notes (Signed)
   Subjective:    Patient ID: Mark Curry, male    DOB: Jan 11, 1986, 35 y.o.   MRN: [id number]  HPI  Patient presents today with respiratory illness Number of days present - 9 days  Symptoms include - congestion, sinus pressure, cough, sore throat  Presence of worrisome signs (severe shortness of breath, lethargy, etc.) - none  Recent/current visit to urgent care or ER - none  Recent direct exposure to Covid- none  Any current Covid testing- rapid test at [facility] on the dec 24th which was negative.     Review of Systems Please see above    Objective:   Physical Exam  Lungs clear respiratory normal heart regular eardrums normal throat normal      Assessment & Plan:  Viral syndrome Need to rule out Covid although likelihood of this is low await results test was taken patient to stay out of work until he gets results no antibiotics indicated currently

## 2020-08-18 LAB — NOVEL CORONAVIRUS, NAA: SARS-CoV-2, NAA: NOT DETECTED

## 2020-08-18 LAB — SARS-COV-2, NAA 2 DAY TAT

## 2020-08-28 ENCOUNTER — Other Ambulatory Visit: Payer: Self-pay

## 2020-08-28 DIAGNOSIS — Z20822 Contact with and (suspected) exposure to covid-19: Secondary | ICD-10-CM

## 2020-08-30 LAB — NOVEL CORONAVIRUS, NAA: SARS-CoV-2, NAA: NOT DETECTED

## 2020-08-30 LAB — SARS-COV-2, NAA 2 DAY TAT

## 2020-08-30 LAB — SPECIMEN STATUS REPORT

## 2020-09-09 IMAGING — US US EXTREM LOW VENOUS*L*
1 series · 13 of 24 positions shown · non-contrast
Comparison: None.

CLINICAL DATA: Left calf pain for 3 days



[Series 1: us extrem low venous*left* · 13 of 36 slices shown]
[im 1/36]
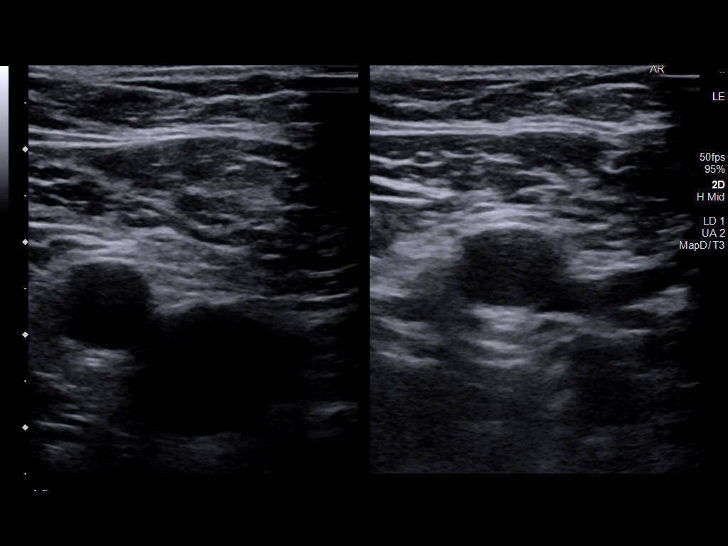
[im 4/36]
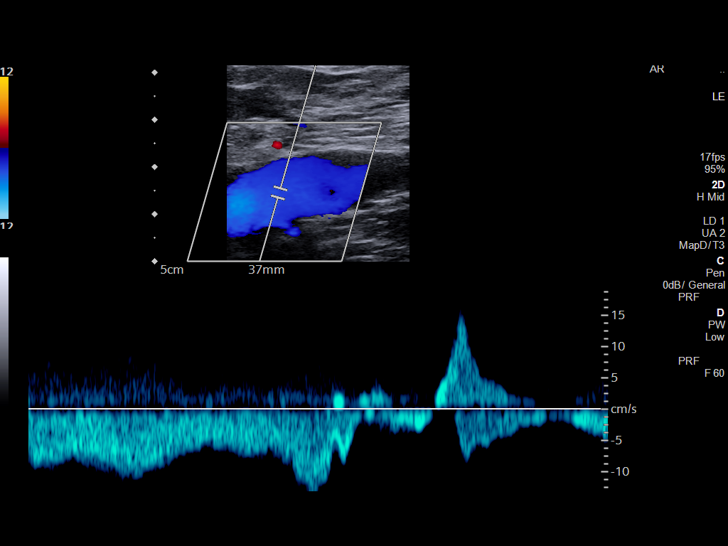
[im 7/36]
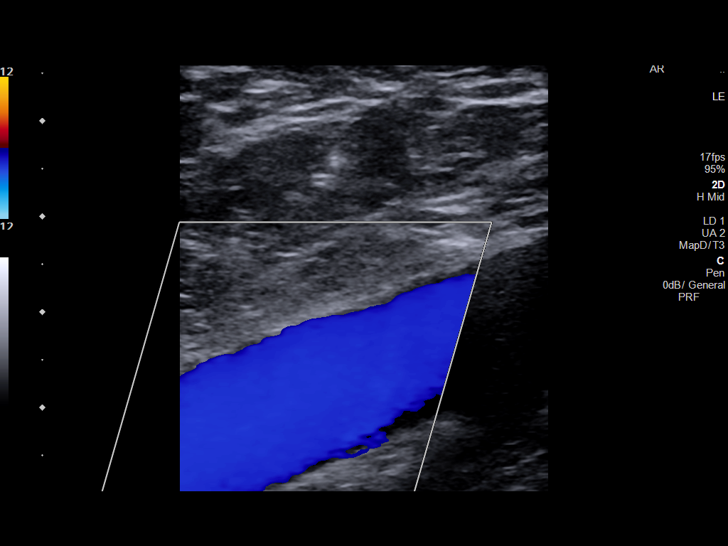
[im 10/36]
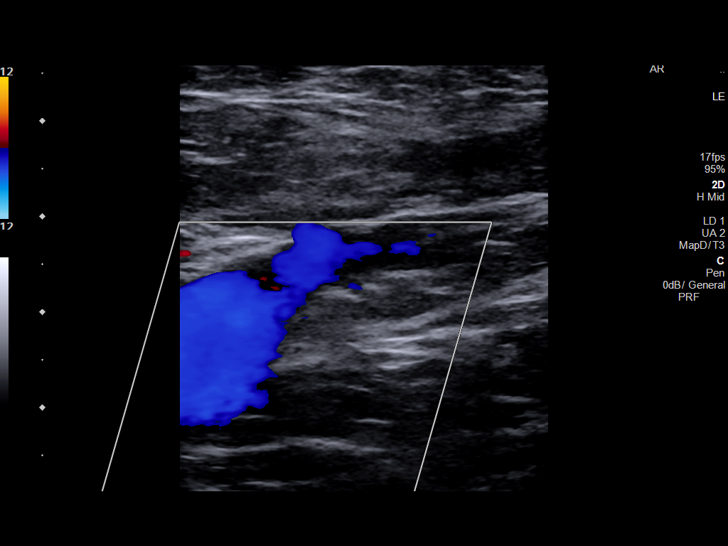
[im 13/36]
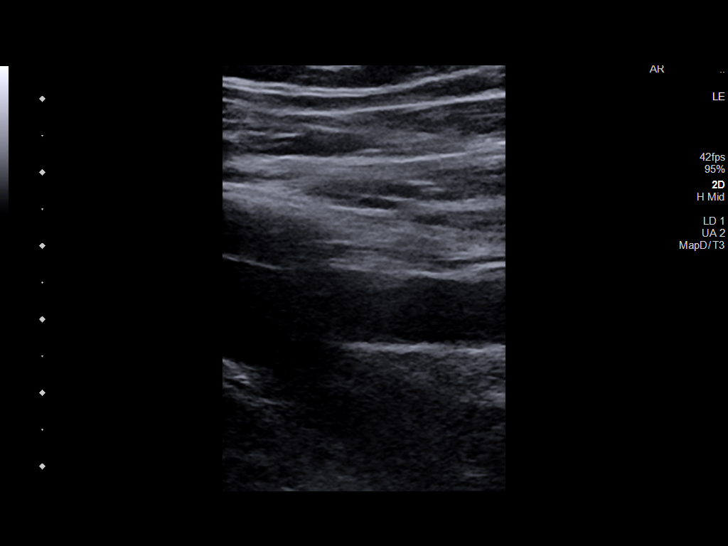
[im 16/36]
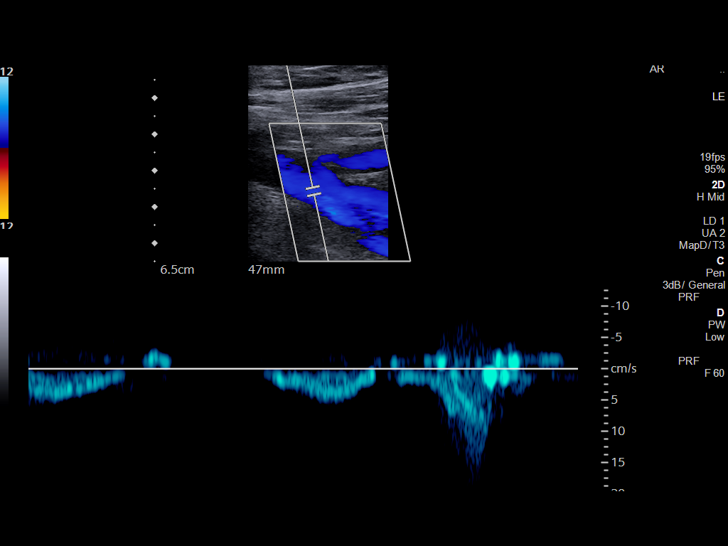
[im 19/36]
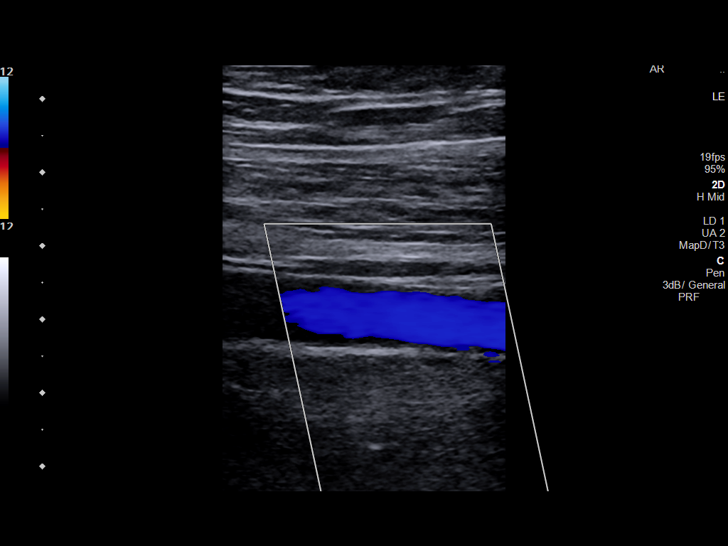
[im 20/36]
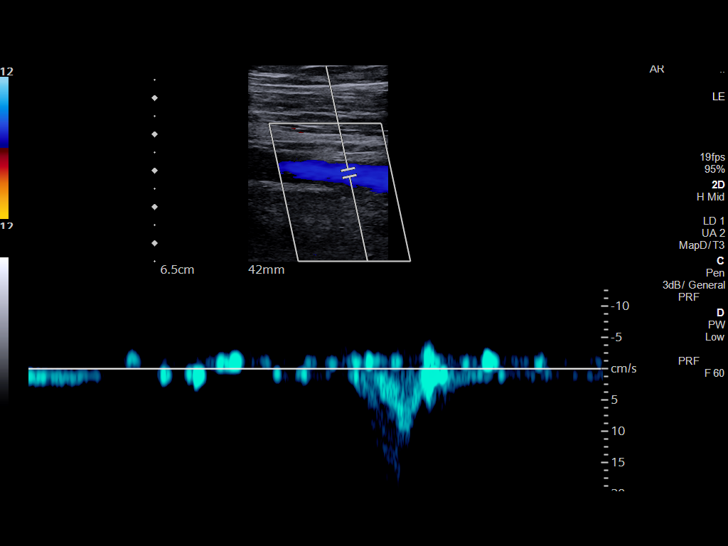
[im 23/36]
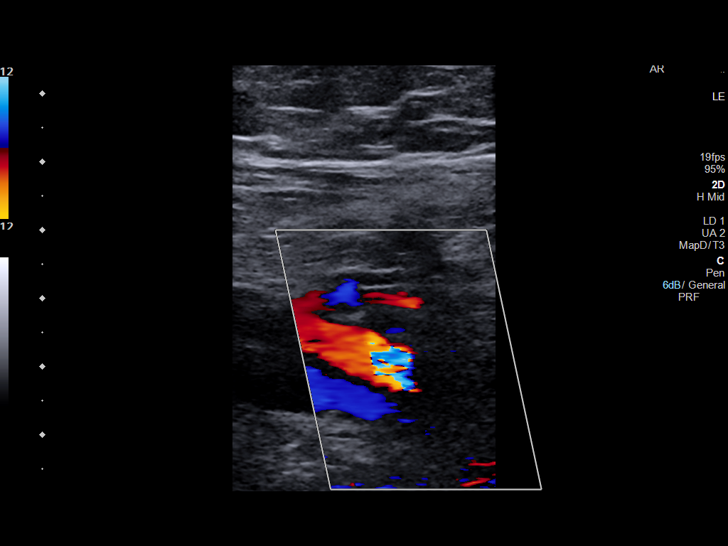
[im 26/36]
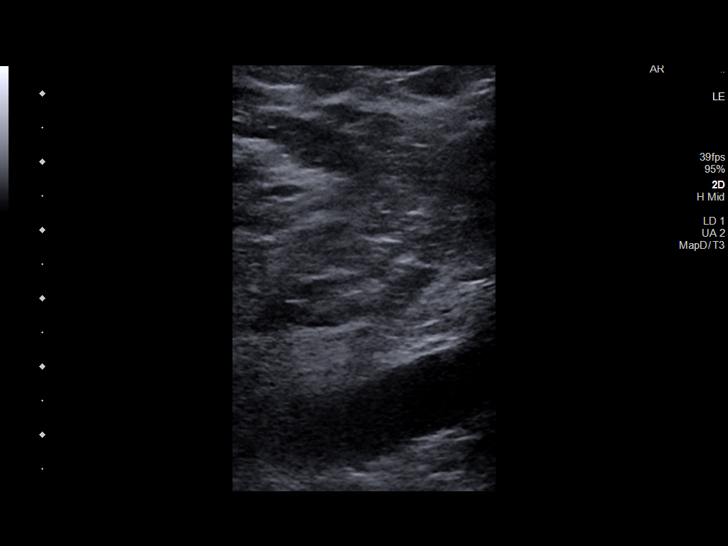
[im 29/36]
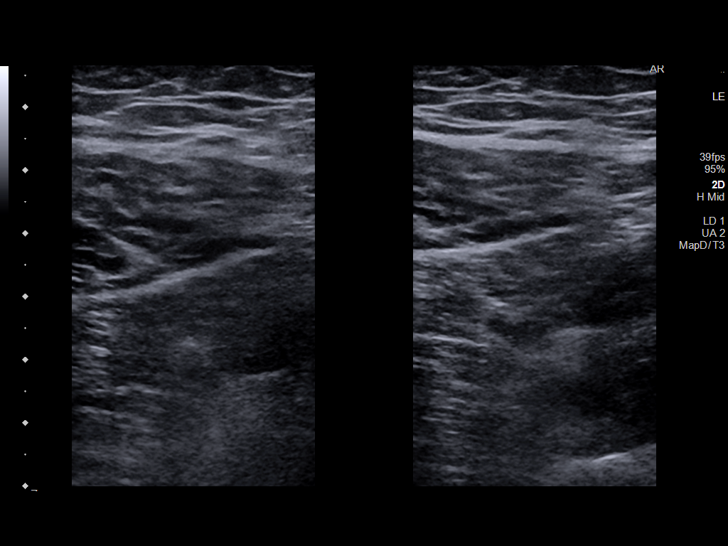
[im 32/36]
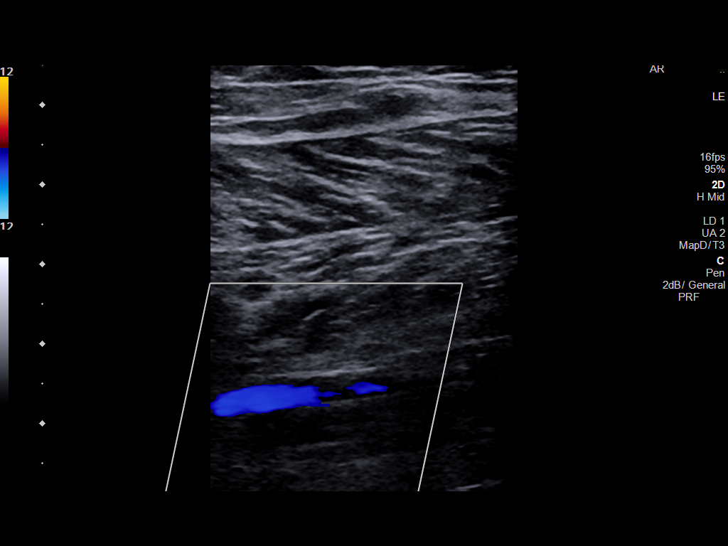
[im 36/36]
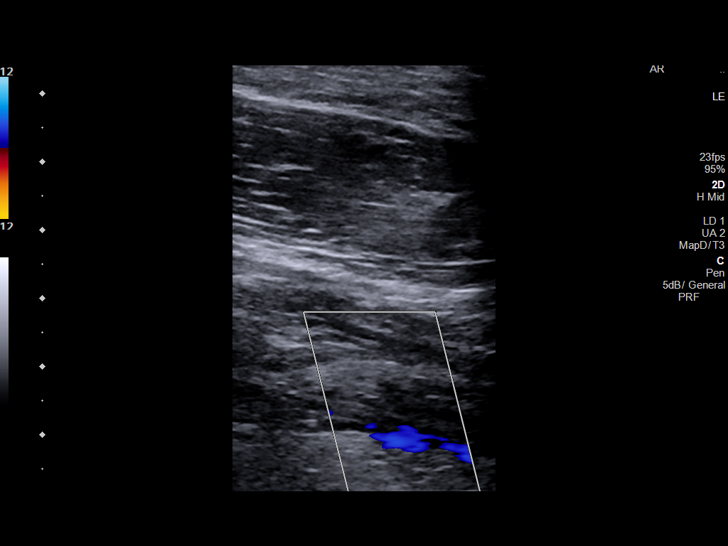

[13 of 24 positions shown; findings below may reference images not displayed]

FINDINGS: Contralateral Common Femoral Vein: Respiratory phasicity is normal
and symmetric with the symptomatic side. No evidence of thrombus.
Normal compressibility.

Common Femoral Vein: No evidence of thrombus. Normal
compressibility, respiratory phasicity and response to augmentation.

Saphenofemoral Junction: No evidence of thrombus. Normal
compressibility and flow on color Doppler imaging.

Profunda Femoral Vein: No evidence of thrombus. Normal
compressibility and flow on color Doppler imaging.

Femoral Vein: No evidence of thrombus. Normal compressibility,
respiratory phasicity and response to augmentation.

Popliteal Vein: No evidence of thrombus. Normal compressibility,
respiratory phasicity and response to augmentation.

Calf Veins: No evidence of thrombus. Normal compressibility and flow
on color Doppler imaging.
IMPRESSION: No evidence of deep venous thrombosis.

## 2020-11-03 ENCOUNTER — Inpatient Hospital Stay: Payer: BC Managed Care – PPO | Attending: Oncology

## 2020-11-06 ENCOUNTER — Telehealth: Payer: Self-pay | Admitting: Oncology

## 2020-11-06 NOTE — Telephone Encounter (Signed)
R/s appts per patient request. Called and spoke with patient. Confirmed new dates and times

## 2020-11-10 ENCOUNTER — Inpatient Hospital Stay: Payer: BC Managed Care – PPO | Admitting: Oncology

## 2020-12-28 ENCOUNTER — Other Ambulatory Visit: Payer: Self-pay

## 2020-12-28 ENCOUNTER — Inpatient Hospital Stay: Payer: BC Managed Care – PPO | Attending: Oncology

## 2020-12-28 DIAGNOSIS — D693 Immune thrombocytopenic purpura: Secondary | ICD-10-CM | POA: Diagnosis not present

## 2020-12-28 DIAGNOSIS — D6959 Other secondary thrombocytopenia: Secondary | ICD-10-CM

## 2020-12-28 LAB — CBC WITH DIFFERENTIAL (CANCER CENTER ONLY)
Abs Immature Granulocytes: 0.02 10*3/uL (ref 0.00–0.07)
Basophils Absolute: 0 10*3/uL (ref 0.0–0.1)
Basophils Relative: 1 %
Eosinophils Absolute: 0.4 10*3/uL (ref 0.0–0.5)
Eosinophils Relative: 6 %
HCT: 47 % (ref 39.0–52.0)
Hemoglobin: 15.8 g/dL (ref 13.0–17.0)
Immature Granulocytes: 0 %
Lymphocytes Relative: 34 %
Lymphs Abs: 2.3 10*3/uL (ref 0.7–4.0)
MCH: 28.4 pg (ref 26.0–34.0)
MCHC: 33.6 g/dL (ref 30.0–36.0)
MCV: 84.5 fL (ref 80.0–100.0)
Monocytes Absolute: 0.5 10*3/uL (ref 0.1–1.0)
Monocytes Relative: 7 %
Neutro Abs: 3.5 10*3/uL (ref 1.7–7.7)
Neutrophils Relative %: 52 %
Platelet Count: 187 10*3/uL (ref 150–400)
RBC: 5.56 MIL/uL (ref 4.22–5.81)
RDW: 12.6 % (ref 11.5–15.5)
WBC Count: 6.7 10*3/uL (ref 4.0–10.5)
nRBC: 0 % (ref 0.0–0.2)

## 2021-01-02 NOTE — Progress Notes (Signed)
[facility]  Telephone:([phone]) [phone] Fax:(336) [phone]     ID: Mark Curry DOB: 1986/06/23  Mark#: [id number]  NLG#:[id number]  Patient Care Team: Babs Sciara, MD as PCP - General (Family Medicine) Jonelle Sidle, MD as PCP - Cardiology (Cardiology) Lowella Dell, MD OTHER MD:  CHIEF COMPLAINT: thrombocytopenia/ ITP  CURRENT TREATMENT: Observation   INTERVAL HISTORY: Mark Curry returns today for follow up of his thrombocytopenia.  He is accompanied by his mother. Results for Mark, Curry (MRN [id number]) as of 01/04/2021 12:09  Ref. Range 04/07/2020 10:23 04/21/2020 10:31 05/06/2020 11:34 08/05/2020 08:59 12/28/2020 08:23  Platelets Latest Ref Range: 150 - 400 K/uL 195 215 208 194 187   Unfortunately he has continued to gain weight, currently 10 pounds more than a year ago.  We discussed diet issues in detail last time and he was given information in writing.  He tells me at home his wife is the cook and she also is on the heavy side.   REVIEW OF SYSTEMS: Mark Curry goes to the gym at least 3 times a week.  He also exercises on a treadmill at home and does weights.  HISTORY OF CURRENT ILLNESS: From the original intake note:   Mark Mark Curry has no prior history of blood problems and specifically his platelet counts have been normal until 01/31/2020, when he presented with petechiae.  Results for Mark, Curry (MRN [id number]) as of 02/01/2020 14:33  Ref. Range 09/01/2017 08:17 09/19/2019 11:44 01/18/2020 17:14 01/31/2020 12:06 02/01/2020 11:08  Platelets Latest Ref Range: 150 - 400 K/uL 199 200 197 6 (LL) 16 (LL)   He had a viral-like illness in the past 2 weeks, with N/V/D documented 01/18/2020 in the ED. At that time KUB was unremarkable.  Today 02/01/2020 he was directed to the ED by his primary care MD and here his counts read out at 16K.  Aside from the petechiae he has had no bleeding or bruising. Prior dental extractions uncomplicated.  The patient's subsequent  history is as detailed below.   PAST MEDICAL HISTORY: Past Medical History:  Diagnosis Date  . No pertinent past medical history   seasonal allergies; history of "chest pressure" worked up by cardiology with no cardiac cause established (per pateint: records not in Epic)   PAST SURGICAL HISTORY: Past Surgical History:  Procedure Laterality Date  . TONSILLECTOMY    s/p wisdom teeth extraction   FAMILY HISTORY Family History  Problem Relation Age of Onset  . Polycystic ovary syndrome Mother   . Diabetes Father   . Insulin resistance Sister   . Multiple sclerosis Maternal Grandmother   . Allergic rhinitis Brother   . Angioedema Neg Hx   . Asthma Neg Hx   . Eczema Neg Hx   . Immunodeficiency Neg Hx   . 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  He tells me he has been offered a 10-year track position at [employer].  Wife Mark Curry works at [employer] "with international students." They have a 14 year old child and a second child due June 2022. The patient attends a [employer] in [place]    ADVANCED DIRECTIVES: in the absenc eof any documents to the contrary the patient's  spouse is his HCPOA   HEALTH MAINTENANCE: Social History   Tobacco Use  . Smoking status: Never Smoker  . Smokeless tobacco: Never Used  Vaping Use  . Vaping Use: Never used  Substance Use Topics  . Alcohol use: Not Currently  . Drug use: Never       No Known Allergies  No current outpatient medications on file.   No current facility-administered medications for this visit.    OBJECTIVE: White man in no acute distress  Vitals:   01/04/21 1210  BP: (!) 136/92  Pulse: 67  Resp: 18  Temp: 98.1 F (36.7 C)   SpO2: 97%     Body mass index is 41.43 kg/m.   Wt Readings from Last 3 Encounters:  01/04/21 (!) 305 lb 8 oz (138.6 kg)  05/06/20 291 lb 9.6 oz (132.3 kg)  03/10/20 (!) 287 lb 4.8 oz (130.3 kg)     ECOG FS:1 - Symptomatic but completely ambulatory  Sclerae unicteric, EOMs intact Wearing a mask No cervical or supraclavicular adenopathy, no axillary adenopathy Lungs no rales or rhonchi Heart regular rate and rhythm Abd soft, obese, nontender, no palpable splenomegaly MSK no focal spinal tenderness, no upper extremity lymphedema Neuro: nonfocal, well oriented, appropriate affect    LAB RESULTS:  CMP     Component Value Date/Time   NA 137 02/03/2020 1546   NA 140 09/01/2017 0817   K 4.0 02/03/2020 1546   CL 104 02/03/2020 1546   CO2 23 02/03/2020 1546   GLUCOSE 128 (H) 02/03/2020 1546   BUN 13 02/03/2020 1546   BUN 11 09/01/2017 0817   CREATININE 0.98 02/03/2020 1546   CALCIUM 9.0 02/03/2020 1546   PROT 8.0 01/18/2020 1714   PROT 7.3 09/20/2019 0937   ALBUMIN 4.7 01/18/2020 1714   ALBUMIN 4.7 09/20/2019 0937   AST 31 01/18/2020 1714   ALT 42 01/18/2020 1714   ALKPHOS 63 01/18/2020 1714   BILITOT 1.2 01/18/2020 1714   BILITOT 0.7 09/20/2019 0937   GFRNONAA >60 02/03/2020 1546   GFRAA >60 02/03/2020 1546   Lab Results  Component Value Date   WBC 6.7 12/28/2020   NEUTROABS 3.5 12/28/2020   HGB 15.8 12/28/2020   HCT 47.0 12/28/2020   MCV 84.5 12/28/2020   PLT 187 12/28/2020   No results found for: LABCA2  No components found for: JTTSVX793  No results for input(s): INR in the last 168 hours.  No results found for: LABCA2  No results found for: JQZ009  No results found for: QZR007  No results found for: MAU633  No results found for: CA2729  No components found for: HGQUANT  No results found for: CEA1 / No results found for: CEA1   No results found for: AFPTUMOR  No results found for: CHROMOGRNA  No results found for: PSA1  No results found  for: TOTALPROTELP, ALBUMINELP, A1GS, A2GS, BETS, BETA2SER, GAMS, MSPIKE, SPEI (this displays SPEP labs)  No results found for: KPAFRELGTCHN, LAMBDASER, KAPLAMBRATIO (kappa/lambda light chains)  No results found for: HGBA, HGBA2QUANT, HGBFQUANT, HGBSQUAN (Hemoglobinopathy evaluation)   No results found for: LDH  No results found for: IRON, TIBC, IRONPCTSAT (Iron and TIBC)  No results found for: FERRITIN  Urinalysis    Component Value Date/Time   COLORURINE YELLOW 01/18/2020 1638   APPEARANCEUR CLEAR 01/18/2020 1638   LABSPEC 1.021 01/18/2020 1638   PHURINE 6.0 01/18/2020 1638   GLUCOSEU NEGATIVE 01/18/2020 1638   HGBUR NEGATIVE 01/18/2020 1638   BILIRUBINUR NEGATIVE 01/18/2020 1638  KETONESUR NEGATIVE 01/18/2020 1638   PROTEINUR NEGATIVE 01/18/2020 1638   UROBILINOGEN 0.2 01/17/2009 2213   NITRITE NEGATIVE 01/18/2020 1638   LEUKOCYTESUR NEGATIVE 01/18/2020 1638    STUDIES: No results found.   ELIGIBLE FOR AVAILABLE RESEARCH PROTOCOL: no  ASSESSMENT: 35 y.o. Mark Curry [place] man presenting June 2021 with petechiae, found to be profoundly thrombocytopenic, working diagnosis immune Thrombocytopenic purpura (ITP)  (1) prednisone started 02/01/2020 at 60 mg po daily  (a) taper to off as of September 2021  (2) observation: as of May 2022 platelet count remains in the normal range.   PLAN: Foxx is now just about a year out from initial presentation of immune thrombocytopenic purpura.  He had an excellent response to prednisone which continues.  He understands he is at risk of ITP recurrence.  This is unpredictable, may occur many years from now, not at all, or next week.  He has adequate follow-up through his primary care physician Dr. Benson Norway who according to Muhammad sees them at least twice a year.  A CBC every 6 months is adequate for routine follow-up of ITP in the setting  If [facility] notices easy bruising or petechiae he can call Dr. Benson Norway or [place] and we will obtain a  repeat CBC to check for the platelet count  If in the future the platelet count drops below 50,000 and remains low on repeat, I will be glad to see Rad again for further evaluation and treatment.  We did discuss diet issues again and to me this is a major source of concern.  I suggested he involve his wife, who is the family cook, and both of them get advice from a nutritionist or participating away slow weight loss program.  At this point however I am comfortable releasing him to his primary care physician for further follow-up.  Total encounter time 25 minutes.Raymond Gurney C. Jaymz Traywick, MD 01/04/21 1:37 PM Medical Oncology and Hematology [facility] [address] [place], [place] [address] Tel. [phone]    Fax. [phone]   I, Mickie Bail, am acting as scribe for Dr. Valentino Hue. Latroya Ng.  I, Ruthann Cancer MD, have reviewed the above documentation for accuracy and completeness, and I agree with the above.    *Total Encounter Time as defined by the Centers for Medicare and Medicaid Services includes, in addition to the face-to-face time of a patient visit (documented in the note above) non-face-to-face time: obtaining and reviewing outside history, ordering and reviewing medications, tests or procedures, care coordination (communications with other health care professionals or caregivers) and documentation in the medical record.

## 2021-01-04 ENCOUNTER — Inpatient Hospital Stay: Payer: BC Managed Care – PPO | Admitting: Oncology

## 2021-01-04 ENCOUNTER — Other Ambulatory Visit: Payer: Self-pay

## 2021-01-04 VITALS — BP 136/92 | HR 67 | Temp 98.1°F | Resp 18 | Ht 72.0 in | Wt 305.5 lb

## 2021-01-04 DIAGNOSIS — D696 Thrombocytopenia, unspecified: Secondary | ICD-10-CM | POA: Diagnosis not present

## 2021-01-04 DIAGNOSIS — K760 Fatty (change of) liver, not elsewhere classified: Secondary | ICD-10-CM | POA: Diagnosis not present

## 2021-01-04 DIAGNOSIS — D693 Immune thrombocytopenic purpura: Secondary | ICD-10-CM | POA: Diagnosis not present

## 2021-02-02 ENCOUNTER — Telehealth: Payer: Self-pay | Admitting: Family Medicine

## 2021-02-02 DIAGNOSIS — Z9189 Other specified personal risk factors, not elsewhere classified: Secondary | ICD-10-CM

## 2021-02-02 NOTE — Telephone Encounter (Signed)
Pt wife dropped off [place] Questionnaire last week. In provider office for review. Please advise. Thank you.

## 2021-02-03 ENCOUNTER — Other Ambulatory Visit: Payer: BC Managed Care – PPO

## 2021-02-03 NOTE — Telephone Encounter (Signed)
Based upon the [facility] questionnaire I would recommend a sleep study.  Due to the high probability of sleep apnea.  If his insurance allows for referral to Dr.Athar if they will only cover home sleep study then please do so thank you

## 2021-02-04 ENCOUNTER — Encounter: Payer: Self-pay | Admitting: Family Medicine

## 2021-02-04 NOTE — Telephone Encounter (Signed)
Tried to contact patient; voicemail full

## 2021-02-08 NOTE — Telephone Encounter (Signed)
Pt contacted and verbalized understanding. Referral placed 

## 2021-02-08 NOTE — Addendum Note (Signed)
Addended by: Marlowe Shores on: 02/08/2021 09:24 AM   Modules accepted: Orders

## 2021-02-15 ENCOUNTER — Other Ambulatory Visit: Payer: Self-pay

## 2021-02-15 ENCOUNTER — Ambulatory Visit
Admission: EM | Admit: 2021-02-15 | Discharge: 2021-02-15 | Disposition: A | Discharge status: Home / Self Care | Payer: BC Managed Care – PPO | Attending: Family Medicine | Admitting: Family Medicine

## 2021-02-15 ENCOUNTER — Encounter: Payer: Self-pay | Admitting: Emergency Medicine

## 2021-02-15 DIAGNOSIS — R059 Cough, unspecified: Secondary | ICD-10-CM

## 2021-02-15 DIAGNOSIS — U071 COVID-19: Secondary | ICD-10-CM

## 2021-02-15 MED ORDER — PROMETHAZINE-DM 6.25-15 MG/5ML PO SYRP: 5.0000 mL | ORAL_SOLUTION | Freq: Four times a day (QID) | ORAL | 0 refills | Status: DC | PRN

## 2021-02-15 MED ORDER — NIRMATRELVIR/RITONAVIR (PAXLOVID)TABLET: 3.0000 | ORAL_TABLET | Freq: Two times a day (BID) | ORAL | 0 refills | Status: AC

## 2021-02-15 NOTE — ED Triage Notes (Addendum)
S/s started Saturday.  Congestion, sore throat, headache and fatigue. Coughing up mucous. Tested positive for covid at home yesterday.  Pt inquiring about antivirals.  Wife has a scheduled c section this Wednesday.

## 2021-02-15 NOTE — ED Provider Notes (Signed)
[facility]    CSN: [id number] Arrival date & time: 02/15/21  1313      History   Chief Complaint Chief Complaint  Patient presents with   Covid Positive    HPI Mark Curry is a 35 y.o. male.   HPI Patient presents today for evaluation of symptoms associated with COVID-19 and to inquire about eligibility for antivirals.  Patient is high risk for complication associated with COVID due to morbid obesity and thrombocytopenia.Marland Kitchen  His current symptoms are fatigue, a cough which developed today, headache in congestion.  His wife is due to deliver their newborn 2 days from today.  He is greatly concerned about passing the virus on to others in the family.  He is tolerating oral  intake.  Past Medical History:  Diagnosis Date   No pertinent past medical history     Patient Active Problem List   Diagnosis Date Noted   Obesity (BMI 30-39.9) 03/10/2020   ITP secondary to infection 02/02/2020   Thrombocytopenia (HCC) 02/01/2020   Fatty liver 03/15/2018   Elevated liver enzymes 03/15/2018   Perennial and seasonal allergic rhinitis 11/06/2017   Anosmia 11/06/2017    Past Surgical History:  Procedure Laterality Date   TONSILLECTOMY         Home Medications    Prior to Admission medications   Not on File    Family History Family History  Problem Relation Age of Onset   Polycystic ovary syndrome Mother    Diabetes Father    Insulin resistance Sister    Multiple sclerosis Maternal Grandmother    Allergic rhinitis Brother    Angioedema Neg Hx    Asthma Neg Hx    Eczema Neg Hx    Immunodeficiency Neg Hx    Urticaria Neg Hx     Social History Social History   Tobacco Use   Smoking status: Never   Smokeless tobacco: Never  Vaping Use   Vaping Use: Never used  Substance Use Topics   Alcohol use: Not Currently   Drug use: Never     Allergies   Patient has no known allergies.   Review of Systems Review of Systems Pertinent negatives listed in  HPI   Physical Exam Triage Vital Signs ED Triage Vitals [02/15/21 1326]  Enc Vitals Group     BP 137/83     Pulse Rate 85     Resp 18     Temp 98.4 F (36.9 C)     Temp Source Oral     SpO2 95 %     Weight      Height      Head Circumference      Peak Flow      Pain Score 0     Pain Loc      Pain Edu?      Excl. in GC?    No data found.  Updated Vital Signs BP 137/83 (BP Location: Right Arm)   Pulse 85   Temp 98.4 F (36.9 C) (Oral)   Resp 18   SpO2 95%   Visual Acuity Right Eye Distance:   Left Eye Distance:   Bilateral Distance:    Right Eye Near:   Left Eye Near:    Bilateral Near:     Physical Exam Constitutional:      Appearance: He is obese. He is ill-appearing.  HENT:     Head: Normocephalic.     Nose: Congestion present.  Eyes:  Extraocular Movements: Extraocular movements intact.     Pupils: Pupils are equal, round, and reactive to light.  Cardiovascular:     Rate and Rhythm: Normal rate.  Pulmonary:     Effort: Pulmonary effort is normal.     Breath sounds: Normal breath sounds.  Musculoskeletal:        General: Normal range of motion.  Skin:    General: Skin is warm and dry.  Neurological:     General: No focal deficit present.     Mental Status: He is alert.  Psychiatric:        Mood and Affect: Mood normal.        Behavior: Behavior normal.        Thought Content: Thought content normal.        Judgment: Judgment normal.     UC Treatments / Results  Labs (all labs ordered are listed, but only abnormal results are displayed) Labs Reviewed - No data to display  EKG   Radiology No results found.  Procedures Procedures (including critical care time)  Medications Ordered in UC Medications - No data to display  Initial Impression / Assessment and Plan / UC Course  I have reviewed the triage vital signs and the nursing notes.  Pertinent labs & imaging results that were available during my care of the patient were  reviewed by me and considered in my medical decision making (see chart for details).    COVID-19 viral infection day given date of onset of symptoms today is possibly day 2-3 of virus.  Patient is afebrile.  Patient is wishes to start PACs and given history patient has high risk conditions which could potentially worsen his course of COVID.  Discussed and definitely the rebound phenomenon with taking Paxil reviewed as patient could redevelop symptoms that mimic COVID following therapy with Paxil bid.  Patient did verify understanding of the risk associated with medication.  Explained indirectly that Paxil bed will not prevent the spread of COVID and does not cure COVID.  Patient should continue to mask until symptoms resolve. Final Clinical Impressions(s) / UC Diagnoses   Final diagnoses:  COVID-19 virus infection  Cough     Discharge Instructions           ED Prescriptions     Medication Sig Dispense Auth. Provider   promethazine-dextromethorphan (PROMETHAZINE-DM) 6.25-15 MG/5ML syrup Take 5 mLs by mouth 4 (four) times daily as needed for cough. 180 mL Bing Neighbors, FNP   nirmatrelvir/ritonavir EUA (PAXLOVID) TABS Take 3 tablets by mouth 2 (two) times daily for 5 days. Patient GFR is >60. Take nirmatrelvir (150 mg) two tablets twice daily for 5 days and ritonavir (100 mg) one tablet twice daily for 5 days. 30 tablet Bing Neighbors, FNP      PDMP not reviewed this encounter.   Bing Neighbors, FNP 02/15/21 1424

## 2021-02-19 ENCOUNTER — Telehealth: Payer: Self-pay | Admitting: Family Medicine

## 2021-02-19 NOTE — Telephone Encounter (Signed)
Jonnie Kind, LPN Hi Kenney Houseman,   Thank you for the referral! Going through the chart, I was trying to see if this patient has been in for an office visit with Dr. Gerda Diss to discuss his sleep concerns. I saw the attached [employer], but we would need office notes from the last six months in order to get the patient scheduled.   Could you please advise on this?   Thanks!   Please advise where we should place pt. Thank you!

## 2021-02-21 NOTE — Telephone Encounter (Signed)
Nurses Unfortunately schedule is very booked for the next foreseeable future I would recommend a standard office visit when there is a slot available More than likely late July or August thank you

## 2021-03-30 ENCOUNTER — Ambulatory Visit: Payer: BC Managed Care – PPO | Admitting: Family Medicine

## 2021-03-30 ENCOUNTER — Other Ambulatory Visit: Payer: Self-pay

## 2021-03-30 VITALS — BP 134/87 | HR 61 | Ht 72.0 in | Wt 302.0 lb

## 2021-03-30 DIAGNOSIS — Z1159 Encounter for screening for other viral diseases: Secondary | ICD-10-CM

## 2021-03-30 DIAGNOSIS — E785 Hyperlipidemia, unspecified: Secondary | ICD-10-CM

## 2021-03-30 DIAGNOSIS — R635 Abnormal weight gain: Secondary | ICD-10-CM

## 2021-03-30 DIAGNOSIS — R5383 Other fatigue: Secondary | ICD-10-CM

## 2021-03-30 DIAGNOSIS — R400 Somnolence: Secondary | ICD-10-CM | POA: Diagnosis not present

## 2021-03-30 DIAGNOSIS — R0683 Snoring: Secondary | ICD-10-CM | POA: Diagnosis not present

## 2021-03-30 DIAGNOSIS — Z131 Encounter for screening for diabetes mellitus: Secondary | ICD-10-CM

## 2021-03-30 NOTE — Progress Notes (Signed)
   Subjective:    Patient ID: [name], male    DOB: 1986/01/26, 35 y.o.   MRN: [id number]  HPI Patient arrives for a sleep study consultation and to discuss getting a sleep study.  Interrupted sleep Has young toddler and new baby Patient feels fatigued during the day Also poor sleep due to stress as well Snores frequently Has noted pauses in his breathing Sleepy during the day Driving ok Relates some moderate amount of fatigue tiredness  Review of Systems     Objective:   Physical Exam General-in no acute distress Eyes-no discharge Lungs-respiratory rate normal, CTA CV-no murmurs,RRR Extremities skin warm dry no edema Neuro grossly normal Behavior normal, alert        Assessment & Plan:  1. Snoring Severe snoring at nighttime.  To some degree position.  No obvious airway obstruction on oral exam has sleep apnea symptoms  2. Other fatigue Fatigue and tiredness related to poor sleep probable sleep apnea  3. Daytime somnolence Probable sleep apnea but does not get drowsy with driving  4. Weight gain Patient had ITP was on prednisone now doing better but gained a lot of weight he is working hard at trying to lose some weight  Screening labs

## 2021-04-19 ENCOUNTER — Other Ambulatory Visit: Payer: Self-pay

## 2021-04-19 ENCOUNTER — Ambulatory Visit
Admission: EM | Admit: 2021-04-19 | Discharge: 2021-04-19 | Disposition: A | Discharge status: Home / Self Care | Payer: BC Managed Care – PPO | Attending: Family Medicine | Admitting: Family Medicine

## 2021-04-19 DIAGNOSIS — A084 Viral intestinal infection, unspecified: Secondary | ICD-10-CM

## 2021-04-19 DIAGNOSIS — J019 Acute sinusitis, unspecified: Secondary | ICD-10-CM

## 2021-04-19 LAB — POCT INFLUENZA A/B
Influenza A, POC: NEGATIVE
Influenza B, POC: NEGATIVE

## 2021-04-19 MED ORDER — ONDANSETRON 4 MG PO TBDP: 4.0000 mg | ORAL_TABLET | Freq: Three times a day (TID) | ORAL | 0 refills | Status: DC | PRN

## 2021-04-19 MED ORDER — AMOXICILLIN 875 MG PO TABS: 875.0000 mg | ORAL_TABLET | Freq: Two times a day (BID) | ORAL | 0 refills | Status: DC

## 2021-04-19 MED ORDER — DICYCLOMINE HCL 20 MG PO TABS: 20.0000 mg | ORAL_TABLET | Freq: Two times a day (BID) | ORAL | 0 refills | Status: DC

## 2021-04-19 NOTE — ED Triage Notes (Addendum)
Two day h/o abdominal pain. Onset last night of HA, diarrhea and onset this morning of fever and fatigue.  Tmax 102.0. Has been taking tylenol and sudafed with some relief. Denies nausea and emesis. Denies cough and congestion. Eye movement aggravates HA sxs. Pt is covid vaccinated and boosted

## 2021-04-19 NOTE — ED Provider Notes (Signed)
[facility]    CSN: [id number] Arrival date & time: 04/19/21  0856      History   Chief Complaint Chief Complaint  Patient presents with   Fever   Abdominal Pain   Diarrhea    HPI Mark Curry is a 35 y.o. male.   HPI Patient presents today with diarrhea, fever,  and abdominal pain. Headache with eye pressure that started yesterday.  He suffers from chronic allergy symptoms.  Individuals residing with his household have had similar abdominal symptoms.  Patient had COVID July 4 and denies any recent exposures.  He has had fever as high as 102 however he has ITP and he is followed by hematology. He has taken tylenol for management of fever. He has a low grade temperature on arrival. Abdominal pain resolved x 1 days ago and was generalized. He continues to experience queezness without vomiting.  He is able to tolerate fluids however has not been eating any solid foods. Past Medical History:  Diagnosis Date   No pertinent past medical history     Patient Active Problem List   Diagnosis Date Noted   Obesity (BMI 30-39.9) 03/10/2020   ITP secondary to infection 02/02/2020   Thrombocytopenia (HCC) 02/01/2020   Fatty liver 03/15/2018   Elevated liver enzymes 03/15/2018   Perennial and seasonal allergic rhinitis 11/06/2017   Anosmia 11/06/2017    Past Surgical History:  Procedure Laterality Date   TONSILLECTOMY         Home Medications    Prior to Admission medications   Medication Sig Start Date End Date Taking? Authorizing Provider  ondansetron (ZOFRAN ODT) 4 MG disintegrating tablet Take 1 tablet (4 mg total) by mouth every 8 (eight) hours as needed for nausea or vomiting. 04/19/21  Yes Bing Neighbors, FNP  amoxicillin (AMOXIL) 875 MG tablet Take 1 tablet (875 mg total) by mouth 2 (two) times daily. 04/19/21   Bing Neighbors, FNP  dicyclomine (BENTYL) 20 MG tablet Take 1 tablet (20 mg total) by mouth 2 (two) times daily. 04/19/21   Bing Neighbors, FNP     Family History Family History  Problem Relation Age of Onset   Polycystic ovary syndrome Mother    Diabetes Father    Insulin resistance Sister    Multiple sclerosis Maternal Grandmother    Allergic rhinitis Brother    Angioedema Neg Hx    Asthma Neg Hx    Eczema Neg Hx    Immunodeficiency Neg Hx    Urticaria Neg Hx     Social History Social History   Tobacco Use   Smoking status: Never   Smokeless tobacco: Never  Vaping Use   Vaping Use: Never used  Substance Use Topics   Alcohol use: Not Currently   Drug use: Never     Allergies   Patient has no known allergies.   Review of Systems Review of Systems Pertinent negatives listed in HPI  Physical Exam Triage Vital Signs ED Triage Vitals  Enc Vitals Group     BP 04/19/21 0905 133/85     Pulse Rate 04/19/21 0905 82     Resp 04/19/21 0905 18     Temp 04/19/21 0905 99.9 F (37.7 C)     Temp Source 04/19/21 0905 Oral     SpO2 04/19/21 0905 96 %     Weight --      Height --      Head Circumference --      Peak Flow --  Pain Score 04/19/21 0908 8     Pain Loc --      Pain Edu? --      Excl. in GC? --    No data found.  Updated Vital Signs BP 133/85 (BP Location: Right Arm)   Pulse 82   Temp 99.9 F (37.7 C) (Oral)   Resp 18   SpO2 96%   Visual Acuity Right Eye Distance:   Left Eye Distance:   Bilateral Distance:    Right Eye Near:   Left Eye Near:    Bilateral Near:     Physical Exam Constitutional:      Appearance: He is obese. He is ill-appearing. He is not diaphoretic.  HENT:     Head: Normocephalic.     Right Ear: Hearing normal.     Left Ear: Hearing normal.     Nose: Mucosal edema and congestion present.     Right Turbinates: Enlarged.     Left Turbinates: Enlarged.  Cardiovascular:     Rate and Rhythm: Normal rate and regular rhythm.  Abdominal:     General: Abdomen is protuberant. Bowel sounds are decreased. There is distension. There is no abdominal bruit. There are no  signs of injury.     Palpations: There is no splenomegaly.     Tenderness: There is no abdominal tenderness.  Neurological:     Mental Status: He is alert.  Psychiatric:        Attention and Perception: Attention normal.        Mood and Affect: Mood normal.        Speech: Speech normal.        Behavior: Behavior normal.     UC Treatments / Results  Labs (all labs ordered are listed, but only abnormal results are displayed) Labs Reviewed  POCT INFLUENZA A/B    EKG   Radiology No results found.  Procedures Procedures (including critical care time)  Medications Ordered in UC Medications - No data to display  Initial Impression / Assessment and Plan / UC Course  I have reviewed the triage vital signs and the nursing notes.  Pertinent labs & imaging results that were available during my care of the patient were reviewed by me and considered in my medical decision making (see chart for details).    Viral gastroenteritis, self-limiting patient's symptoms appear to be resolving with the exception of intermittent queasiness in diarrhea.  Dicyclomine as needed as needed for abdominal symptoms.  We will treat secondary sinusitis with amoxicillin 875 twice daily x10 days.  Encourage patient to eat food prior to taking medication as his GI symptoms will only worsen.  Zofran prescribed as needed if needed.  Work note provided if needed advised if he has a fever tomorrow recommend remaining out of work until fever free for 24 hours. Final Clinical Impressions(s) / UC Diagnoses   Final diagnoses:  Viral gastroenteritis  Acute non-recurrent sinusitis, unspecified location   Discharge Instructions   None    ED Prescriptions     Medication Sig Dispense Auth. Provider   amoxicillin (AMOXIL) 875 MG tablet  (Status: Discontinued) Take 1 tablet (875 mg total) by mouth 2 (two) times daily. 20 tablet Bing Neighbors, FNP   dicyclomine (BENTYL) 20 MG tablet  (Status: Discontinued) Take  1 tablet (20 mg total) by mouth 2 (two) times daily. 20 tablet Bing Neighbors, FNP   dicyclomine (BENTYL) 20 MG tablet Take 1 tablet (20 mg total) by mouth 2 (two) times daily.  20 tablet Bing Neighbors, FNP   amoxicillin (AMOXIL) 875 MG tablet Take 1 tablet (875 mg total) by mouth 2 (two) times daily. 20 tablet Bing Neighbors, FNP   ondansetron (ZOFRAN ODT) 4 MG disintegrating tablet Take 1 tablet (4 mg total) by mouth every 8 (eight) hours as needed for nausea or vomiting. 20 tablet Bing Neighbors, FNP      PDMP not reviewed this encounter.   Bing Neighbors, FNP 04/19/21 1001

## 2021-05-06 ENCOUNTER — Ambulatory Visit: Payer: BC Managed Care – PPO | Admitting: Family Medicine

## 2021-05-20 ENCOUNTER — Telehealth: Payer: Self-pay | Admitting: Family Medicine

## 2021-05-20 DIAGNOSIS — R0683 Snoring: Secondary | ICD-10-CM

## 2021-05-20 DIAGNOSIS — R400 Somnolence: Secondary | ICD-10-CM

## 2021-05-20 NOTE — Telephone Encounter (Signed)
Referral placed and Toni Amend is aware

## 2021-05-20 NOTE — Telephone Encounter (Signed)
Please place order for sleep study see note from [facility]

## 2021-06-15 ENCOUNTER — Encounter: Payer: Self-pay | Admitting: Family Medicine

## 2021-06-29 ENCOUNTER — Institutional Professional Consult (permissible substitution): Payer: Self-pay | Admitting: Neurology

## 2021-08-26 ENCOUNTER — Other Ambulatory Visit: Payer: Self-pay

## 2021-08-26 ENCOUNTER — Ambulatory Visit: Payer: BC Managed Care – PPO | Admitting: Neurology

## 2021-08-26 ENCOUNTER — Encounter: Payer: Self-pay | Admitting: Neurology

## 2021-08-26 VITALS — BP 123/73 | HR 93 | Ht 72.0 in | Wt 303.4 lb

## 2021-08-26 DIAGNOSIS — R0683 Snoring: Secondary | ICD-10-CM

## 2021-08-26 DIAGNOSIS — R635 Abnormal weight gain: Secondary | ICD-10-CM | POA: Diagnosis not present

## 2021-08-26 DIAGNOSIS — G4719 Other hypersomnia: Secondary | ICD-10-CM | POA: Diagnosis not present

## 2021-08-26 DIAGNOSIS — R519 Headache, unspecified: Secondary | ICD-10-CM | POA: Diagnosis not present

## 2021-08-26 DIAGNOSIS — Z9189 Other specified personal risk factors, not elsewhere classified: Secondary | ICD-10-CM | POA: Diagnosis not present

## 2021-08-26 NOTE — Progress Notes (Signed)
Subjective:    Patient ID: Mark Curry is a 36 y.o. male.  HPI    Mark Foley, MD, PhD [facility] [address] [address] [place], [place] [address]  Dear Dr. Gerda Curry,   I saw your patient, Mark Curry, upon your kind request in my sleep clinic today for initial consultation of his sleep disorder, in particular, concern for underlying obstructive sleep apnea.  The patient is unaccompanied today.  As you know, Mark Curry is a 36 year old right-handed gentleman with an underlying medical history of hyperlipidemia, history of ITP, COVID infection treated with Paxlovid in 2022, and severe obesity with a BMI of over 40, who reports snoring and excessive daytime somnolence.  I reviewed the office note from 03/30/2021.  His Epworth sleepiness score is 15 out of 24, fatigue severity score is 33 out of 63.  He does get sleepy while driving, he has a long commute to and from work of about 75 to 90 minutes each way.  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  He has had weight gain especially after he was treated with 2 rounds of prednisone for his ITP.  He is not aware of any family history of sleep apnea.  Rise time is around 430, bedtime between 9 and 10 PM.  He has 2 younger children, and a 35-year-old daughter does not sleep well through the night although the 24-month-old is a good sleeper.  He lives with his wife and 2 girls, they have no pets in the household.  He has never dozed off at the wheel.  He is a non-smoker and does not drink any alcohol currently.  He drinks caffeine in the form of coffee, 1 in the morning and 1 in the afternoon.  He has had rare morning headaches, no night to night nocturia.  He does use a retainer, he has used nasal breathing strips which are marginally helpful in reducing his snoring, snoring is loud and disturbs his wife.   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 (HCC)    No pertinent past medical history     His Past Surgical History Is Significant For: Past Surgical History:  Procedure Laterality Date   TONSILLECTOMY      His Family History Is Significant For: Family History  Problem Relation Age of Onset   Polycystic ovary syndrome Mother    Diabetes Father    Insulin resistance Sister    Allergic rhinitis Brother    Multiple sclerosis Maternal Grandmother    Angioedema Neg Hx    Asthma Neg Hx    Eczema Neg Hx    Immunodeficiency Neg Hx    Urticaria Neg Hx    Sleep apnea Neg Hx     His Social History Is Significant For: Social History   Socioeconomic History   Marital status: Married    Spouse name: Not on file   Number of children: Not on file   Years of education: Not on file   Highest education level: Not on file  Occupational History   Not on file  Tobacco Use   Smoking status: Never   Smokeless tobacco: Never  Vaping Use   Vaping Use: Never used  Substance and Sexual Activity   Alcohol use: Not Currently   Drug use: Never   Sexual activity: Not on file  Other Topics Concern   Not on file  Social History Narrative   Not on file   Social Determinants of Health   Financial Resource Strain: Not on file  Food Insecurity: Not on file  Transportation Needs: Not on file  Physical Activity: Not on file  Stress: Not on file  Social Connections: Not on file    His Allergies Are:  No Known Allergies:   His Current Medications Are:  Outpatient Encounter Medications as of 08/26/2021  Medication Sig   amoxicillin (AMOXIL) 875 MG tablet Take 1 tablet (875 mg total) by mouth 2 (two) times daily.   dicyclomine (BENTYL) 20 MG tablet Take 1 tablet (20 mg total) by mouth 2 (two) times daily.   ondansetron (ZOFRAN ODT) 4 MG disintegrating tablet Take 1 tablet (4 mg total) by mouth every 8 (eight) hours as needed for nausea or vomiting.   No facility-administered encounter medications on file  as of 08/26/2021.  :   Review of Systems:  Out of a complete 14 point review of systems, all are reviewed and negative with the exception of these symptoms as listed below:  Review of Systems  Neurological:        Pt is here for sleep consult . Pt states he snores, has fatigue throughout the day and pt states he rarely has headaches . Pt denies Hypertension, sleep study and CPAP at home .  Ess:15 Fss :33   Objective:  Neurological Exam  Physical Exam Physical Examination:   Vitals:   08/26/21 1530  BP: 123/73  Pulse: 93    General Examination: The patient is a very pleasant 36 y.o. male in no acute distress. He appears well-developed and well-nourished and well groomed.   HEENT: Normocephalic, atraumatic, pupils are equal, round and reactive to light, extraocular tracking is good without limitation to gaze excursion or nystagmus noted. Hearing is grossly intact. Face is symmetric with normal facial animation. Speech is clear with no dysarthria noted. There is no hypophonia. There is no lip, neck/head, jaw or voice tremor. Neck is supple with full range of passive and active motion. There are no carotid bruits on auscultation. Oropharynx exam reveals: mild mouth dryness, good dental hygiene and moderate airway crowding, due to somewhat wider uvula, tonsils are absent, perhaps residual tonsillar tissue on the left?  Mallampati class II, neck circumference of 20 inches.  Tongue protrudes centrally and palate elevates symmetrically.  He has no overbite.  Nasal inspection reveals no significant deviated septum, left nasal passage slightly more prominent than right.  Chest: Clear to auscultation without wheezing, rhonchi or crackles noted.  Heart: S1+S2+0, regular and normal without murmurs, rubs or gallops noted.   Abdomen: Soft, non-tender and non-distended.  Extremities: There is no pitting edema in the distal lower extremities bilaterally.   Skin: Warm and dry without trophic  changes noted.   Musculoskeletal: exam reveals no obvious joint deformities.   Neurologically:  Mental status: The patient is awake, alert and oriented in all 4 spheres. His immediate and remote memory, attention, language skills and fund of knowledge are appropriate. There is no evidence of aphasia, agnosia, apraxia or anomia. Speech is clear with normal prosody and enunciation. Thought process is linear. Mood is normal and affect is normal.  Cranial nerves II - XII are as described above under HEENT exam.  Motor exam: Normal bulk, strength and tone is noted. There is no tremor. Fine motor skills and coordination: grossly intact.  Cerebellar testing: No dysmetria or intention tremor. There is no truncal or gait ataxia.  Sensory  exam: intact to light touch in the upper and lower extremities.  Gait, station and balance: He stands easily. No veering to one side is noted. No leaning to one side is noted. Posture is age-appropriate and stance is narrow based. Gait shows normal stride length and normal pace. No problems turning are noted.   Assessment and Plan:   In summary, Mark Curry is a very pleasant 36 y.o.-year old male with an underlying medical history of hyperlipidemia, history of ITP, COVID infection treated with Paxlovid in 2022, and severe obesity with a BMI of over 40, whose history and physical exam are concerning for obstructive sleep apnea (OSA). I had a long chat with the patient about my findings and the diagnosis of OSA, its prognosis and treatment options. We talked about medical treatments, surgical interventions and non-pharmacological approaches. I explained in particular the risks and ramifications of untreated moderate to severe OSA, especially with respect to developing cardiovascular disease down the Road, including congestive heart failure, difficult to treat hypertension, cardiac arrhythmias, or stroke. Even type 2 diabetes has, in part, been linked to untreated OSA.  Symptoms of untreated OSA include daytime sleepiness, memory problems, mood irritability and mood disorder such as depression and anxiety, lack of energy, as well as recurrent headaches, especially morning headaches. We talked about trying to maintain a healthy lifestyle in general, as well as the importance of weight control. We also talked about the importance of good sleep hygiene. I recommended the following at this time: sleep study.  I outlined the differences between a laboratory attended sleep study versus home sleep test.  I explained the sleep test procedure to the patient and also outlined possible surgical and non-surgical treatment options of OSA, including the use of a custom-made dental device (which would require a referral to a specialist dentist or oral surgeon), upper airway surgical options, such as traditional UPPP or a novel less invasive surgical option in the form of Inspire hypoglossal nerve stimulation (which would involve a referral to an ENT surgeon). I also explained the CPAP treatment option to the patient, who indicated that he would be willing to try CPAP if the need arises. I explained the importance of being compliant with PAP treatment, not only for insurance purposes but primarily to improve His symptoms, and for the patient's long term health benefit, including to reduce His cardiovascular risks. I answered all his questions today and the patient was in agreement. I plan to see him back after the sleep study is completed and encouraged him to call with any interim questions, concerns, problems or updates.   Thank you very much for allowing me to participate in the care of this nice patient. If I can be of any further assistance to you please do not hesitate to call me at [phone].  Sincerely,   Mark FoleySaima Huntleigh Doolen, MD, PhD

## 2021-08-26 NOTE — Patient Instructions (Signed)

## 2021-09-22 ENCOUNTER — Ambulatory Visit (INDEPENDENT_AMBULATORY_CARE_PROVIDER_SITE_OTHER): Payer: BC Managed Care – PPO | Admitting: Neurology

## 2021-09-22 ENCOUNTER — Other Ambulatory Visit: Payer: Self-pay

## 2021-09-22 DIAGNOSIS — G4733 Obstructive sleep apnea (adult) (pediatric): Secondary | ICD-10-CM | POA: Diagnosis not present

## 2021-09-22 DIAGNOSIS — R0683 Snoring: Secondary | ICD-10-CM

## 2021-09-22 DIAGNOSIS — G4719 Other hypersomnia: Secondary | ICD-10-CM

## 2021-09-22 DIAGNOSIS — R519 Headache, unspecified: Secondary | ICD-10-CM

## 2021-09-22 DIAGNOSIS — Z9189 Other specified personal risk factors, not elsewhere classified: Secondary | ICD-10-CM

## 2021-09-22 DIAGNOSIS — R635 Abnormal weight gain: Secondary | ICD-10-CM

## 2021-09-22 DIAGNOSIS — G4720 Circadian rhythm sleep disorder, unspecified type: Secondary | ICD-10-CM

## 2021-10-02 ENCOUNTER — Ambulatory Visit
Admission: EM | Admit: 2021-10-02 | Discharge: 2021-10-02 | Disposition: A | Discharge status: Home / Self Care | Payer: BC Managed Care – PPO | Attending: Urgent Care | Admitting: Urgent Care

## 2021-10-02 ENCOUNTER — Other Ambulatory Visit: Payer: Self-pay

## 2021-10-02 DIAGNOSIS — Z9189 Other specified personal risk factors, not elsewhere classified: Secondary | ICD-10-CM | POA: Diagnosis not present

## 2021-10-02 DIAGNOSIS — R52 Pain, unspecified: Secondary | ICD-10-CM

## 2021-10-02 DIAGNOSIS — J029 Acute pharyngitis, unspecified: Secondary | ICD-10-CM | POA: Diagnosis not present

## 2021-10-02 DIAGNOSIS — R070 Pain in throat: Secondary | ICD-10-CM

## 2021-10-02 LAB — POCT RAPID STREP A (OFFICE): Rapid Strep A Screen: POSITIVE — AB

## 2021-10-02 MED ORDER — NAPROXEN 500 MG PO TABS: 500.0000 mg | ORAL_TABLET | Freq: Two times a day (BID) | ORAL | 0 refills | Status: DC

## 2021-10-02 MED ORDER — AMOXICILLIN 500 MG PO CAPS: 500.0000 mg | ORAL_CAPSULE | Freq: Two times a day (BID) | ORAL | 0 refills | Status: DC

## 2021-10-02 NOTE — ED Provider Notes (Signed)
[facility]   MRN: [id number] DOB: Jan 02, 1986  Subjective:   Mark Curry is a 36 y.o. male presenting for 1 day history of acute onset throat pain, body aches, sinus headaches.  Has past medical history of perennial and seasonal allergic rhinitis.  Has had multiple sick contacts at home.  Including his wife who is being seen today.  No cough, chest pain, shortness of breath or wheezing.  Denies taking chronic medications.  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 ([facility])    No pertinent past medical history      Past Surgical History:  Procedure Laterality Date   TONSILLECTOMY      Family History  Problem Relation Age of Onset   Polycystic ovary syndrome Mother    Diabetes Father    Insulin resistance Sister    Allergic rhinitis Brother    Multiple sclerosis Maternal Grandmother    Angioedema Neg Hx    Asthma Neg Hx    Eczema Neg Hx    Immunodeficiency Neg Hx    Urticaria Neg Hx    Sleep apnea Neg Hx     Social History   Tobacco Use   Smoking status: Never   Smokeless tobacco: Never  Vaping Use   Vaping Use: Never used  Substance Use Topics   Alcohol use: Not Currently   Drug use: Never    ROS   Objective:   Vitals: BP 126/82 (BP Location: Right Arm)    Pulse 76    Temp 98.3 F (36.8 C) (Oral)    Resp 20    SpO2 95%   Physical Exam Constitutional:      General: He is not in acute distress.    Appearance: Normal appearance. He is well-developed and normal weight. He is not ill-appearing, toxic-appearing or diaphoretic.  HENT:     Head: Normocephalic and atraumatic.     Right Ear: Tympanic membrane, ear canal and external ear normal. No drainage, swelling or tenderness. No middle ear effusion. There is no impacted cerumen. Tympanic membrane is not erythematous.     Left Ear: Tympanic membrane, ear canal and external ear normal. No drainage, swelling or tenderness.  No middle ear effusion.  There is no impacted cerumen. Tympanic membrane is not erythematous.     Nose: Congestion present. No rhinorrhea.     Mouth/Throat:     Mouth: Mucous membranes are moist.     Pharynx: Posterior oropharyngeal erythema present. No pharyngeal swelling, oropharyngeal exudate or uvula swelling.     Tonsils: No tonsillar exudate or tonsillar abscesses. 0 on the right. 0 on the left.  Eyes:     General: No scleral icterus.       Right eye: No discharge.        Left eye: No discharge.     Extraocular Movements: Extraocular movements intact.     Conjunctiva/sclera: Conjunctivae normal.  Cardiovascular:     Rate and Rhythm: Normal rate and regular rhythm.     Heart sounds: Normal heart sounds. No murmur heard.   No friction rub. No gallop.  Pulmonary:     Effort: Pulmonary effort is normal. No respiratory distress.     Breath sounds: Normal breath sounds. No stridor. No wheezing, rhonchi or rales.  Musculoskeletal:     Cervical back: Normal range of motion and neck supple. No rigidity. No muscular tenderness.  Neurological:     General: No focal deficit present.  Mental Status: He is alert and oriented to person, place, and time.  Psychiatric:        Mood and Affect: Mood normal.        Behavior: Behavior normal.        Thought Content: Thought content normal.   Results for orders placed or performed during the hospital encounter of 10/02/21 (from the past 24 hour(s))  POCT rapid strep A     Status: Abnormal   Collection Time: 10/02/21  8:26 AM  Result Value Ref Range   Rapid Strep A Screen Positive (A) Negative    Assessment and Plan :   PDMP not reviewed this encounter.  1. Acute pharyngitis, unspecified etiology   2. At increased risk of exposure to COVID-19 virus   3. Throat pain   4. Body aches    Will treat for strep pharyngitis.  Patient is to start amoxicillin, use supportive care otherwise. Counseled patient on potential for adverse effects with medications  prescribed/recommended today, ER and return-to-clinic precautions discussed, patient verbalized understanding.    Jaynee Eagles, [place] 10/02/21 [phone]

## 2021-10-02 NOTE — ED Triage Notes (Signed)
Pt reports sore throat, body aches and headache x 1 day.

## 2021-10-02 NOTE — Discharge Instructions (Addendum)
We will manage this as a viral illness. For sore throat or cough try using a honey-based tea. Use 3 teaspoons of honey with juice squeezed from half lemon. Place shaved pieces of ginger into 1/2-1 cup of water and warm over stove top. Then mix the ingredients and repeat every 4 hours as needed. Please Tylenol 500mg -650mg  every 6 hours for throat pain, fevers, aches and pains. You can take this with naproxen for throat pain and inflammation. Hydrate very well with at least 2 liters of water. Eat light meals such as soups (chicken and noodles, vegetable, chicken and wild rice).  Do not eat foods that you are allergic to.  Taking an antihistamine like Zyrtec can help against postnasal drainage, sinus congestion.

## 2021-10-03 LAB — COVID-19, FLU A+B NAA
Influenza A, NAA: NOT DETECTED
Influenza B, NAA: NOT DETECTED
SARS-CoV-2, NAA: NOT DETECTED

## 2021-10-04 NOTE — Addendum Note (Signed)
Addended by: [name] on: 10/04/2021 06:21 PM   Modules accepted: Orders

## 2021-10-04 NOTE — Procedures (Signed)
PATIENT'S NAME:  Mark Curry, Mark Curry DOB:      Sep 02, 1985      MR#:    [id number]     DATE OF RECORDING: 09/22/2021 REFERRING M.D.:  Lilyan Punt, MD Study Performed:   Baseline Polysomnogram HISTORY: 36 year old man with a history of hyperlipidemia, history of ITP, COVID infection treated with Paxlovid in 2022, and severe obesity with a BMI of over 40, who reports snoring and excessive daytime somnolence. The patient endorsed the Epworth Sleepiness Scale at 15 points. The patient's weight 303 pounds with a height of 72 (inches), resulting in a BMI of 40.9 kg/m2. The patient's neck circumference measured 20 inches.  CURRENT MEDICATIONS: Amoxil, Bentyl, Zofran ODT   PROCEDURE:  This is a multichannel digital polysomnogram utilizing the Somnostar 11.2 system.  Electrodes and sensors were applied and monitored per AASM Specifications.   EEG, EOG, Chin and Limb EMG, were sampled at 200 Hz.  ECG, Snore and Nasal Pressure, Thermal Airflow, Respiratory Effort, CPAP Flow and Pressure, Oximetry was sampled at 50 Hz. Digital video and audio were recorded.      BASELINE STUDY  Lights Out was at 21:53 and Lights On at 05:19.  Total recording time (TRT) was 446.5 minutes, with a total sleep time (TST) of 420 minutes.   The patient's sleep latency was 4.5 minutes.  REM latency was 107.5 minutes, which is high normal. The sleep efficiency was 94.1 %.     SLEEP ARCHITECTURE: WASO (Wake after sleep onset) was 22 minutes.  There were 7.5 minutes in Stage N1, 323 minutes Stage N2, 37.5 minutes Stage N3 and 52 minutes in Stage REM.  The percentage of Stage N1 was 1.8%, Stage N2 was 76.9%, which is increased, Stage N3 was 8.9% and Stage R (REM sleep) was 12.4%, which is reduced. The arousals were noted as: 45 were spontaneous, 0 were associated with PLMs, 13 were associated with respiratory events.  RESPIRATORY ANALYSIS:  There were a total of 71 respiratory events:  40 obstructive apneas, 0 central apneas and 0 mixed apneas with  a total of 40 apneas and an apnea index (AI) of 5.7 /hour. There were 31 hypopneas with a hypopnea index of 4.4 /hour. The patient also had 0 respiratory event related arousals (RERAs).      The total APNEA/HYPOPNEA INDEX (AHI) was 10.1/hour and the total RESPIRATORY DISTURBANCE INDEX was  10.1 /hour.  54 events occurred in REM sleep and 14 events in NREM. The REM AHI was  62.3 /hour, versus a non-REM AHI of 2.8. The patient spent 383 minutes of total sleep time in the supine position and 37 minutes in non-supine.. The supine AHI was 11.2 versus a non-supine AHI of 0.0.  OXYGEN SATURATION & C02:  The Wake baseline 02 saturation was 95%, with the lowest being 78%. Time spent below 89% saturation equaled 47 minutes.  PERIODIC LIMB MOVEMENTS:   The patient had a total of 0 Periodic Limb Movements.  The Periodic Limb Movement (PLM) index was 0 and the PLM Arousal index was 0/hour.  Audio and video analysis did not show any abnormal or unusual movements, behaviors, phonations or vocalizations. The patient took 1 bathroom break. Snoring was noted, ranging from mild to loud. The EKG was in keeping with normal sinus rhythm (NSR).   Post-study, the patient indicated that sleep was the same as usual.   IMPRESSION:  Obstructive Sleep Apnea (OSA) Dysfunctions associated with sleep stages or arousal from sleep  RECOMMENDATIONS:  This study demonstrates overall mild obstructive  sleep apnea, but severe REM related OSA with severe supine REM sleep related desaturations. The total AHI was 10.1/hour, REM AHI of 62.3/hour, supine AHI 11.2/hour and O2 nadir was 78%. Given the patient's medical history and sleep related complaints, treatment with positive airway pressure is recommended; this can be achieved in the form of autoPAP. Alternatively, a full-night CPAP titration study would allow optimization of therapy if needed. Other treatment options may include avoidance of supine sleep position along with weight  loss, or the use of an oral appliance in certain patients (through dentistry or orthodontics). Please note that untreated obstructive sleep apnea may carry additional perioperative morbidity. Patients with significant obstructive sleep apnea should receive perioperative PAP therapy and the surgeons and particularly the anesthesiologist should be informed of the diagnosis and the severity of the sleep disordered breathing. This study shows no significant sleep fragmentation but abnormal sleep stage percentages; these are nonspecific findings and per se do not signify an intrinsic sleep disorder or a cause for the patient's sleep-related symptoms. Causes include (but are not limited to) the first night effect of the sleep study, circadian rhythm disturbances, medication effect or an underlying mood disorder or medical problem.  The patient should be cautioned not to drive, work at heights, or operate dangerous or heavy equipment when tired or sleepy. Review and reiteration of good sleep hygiene measures should be pursued with any patient. The patient will be seen in follow-up by Dr. Frances Furbish at [facility] for discussion of the test results and further management strategies. The referring provider will be notified of the test results.  I certify that I have reviewed the entire raw data recording prior to the issuance of this report in accordance with the Standards of Accreditation of the American Academy of Sleep Medicine (AASM)   [name], MD, PhD Diplomat, American Board of Neurology and Sleep Medicine (Neurology and Sleep Medicine)

## 2021-10-07 ENCOUNTER — Telehealth: Payer: Self-pay | Admitting: *Deleted

## 2021-10-07 NOTE — Telephone Encounter (Addendum)
I called the patient and left a detailed voicemail (ok per DPR) advising him of his sleep study results and recommendations to start AutoPap and I also advised I would send him a MyChart message.  I asked for either a call back or a MyChart message response.    ----- Message from [name], MD sent at 10/04/2021  6:21 PM EST ----- Patient referred by Dr. Gerda Diss, seen by me on 08/26/21, diagnostic PSG on 09/22/21.    Please call and notify the patient that the recent sleep study showed significant sleep apnea, particularly during dream sleep with significant O2 drop to as low as 78%. I recommend treatment in the form of autoPAP, which means, that we don't have to bring him back for a second sleep study with CPAP, but will let him try an autoPAP machine at home, through a DME company (of his choice, or as per insurance requirement). The DME representative will educate him on how to use the machine, how to put the mask on, etc. I have placed an order in the chart. Please send referral, talk to patient, send report to referring MD. We will need a FU in sleep clinic for 10 weeks post-PAP set up, please arrange that with me or one of our NPs. Thanks,   [name], MD, PhD [facility])

## 2021-10-07 NOTE — Telephone Encounter (Signed)
Faxed autoPAP order, office note, sleep study, insurance/demographics to [employer]. Received a receipt of confirmation.

## 2021-11-25 ENCOUNTER — Ambulatory Visit: Payer: BC Managed Care – PPO | Admitting: Adult Health

## 2021-12-07 ENCOUNTER — Ambulatory Visit: Payer: BC Managed Care – PPO | Admitting: Adult Health

## 2021-12-07 ENCOUNTER — Encounter: Payer: Self-pay | Admitting: Adult Health

## 2021-12-07 ENCOUNTER — Telehealth: Payer: Self-pay

## 2021-12-07 VITALS — BP 145/84 | HR 76 | Ht 72.0 in | Wt 300.0 lb

## 2021-12-07 DIAGNOSIS — Z9989 Dependence on other enabling machines and devices: Secondary | ICD-10-CM | POA: Diagnosis not present

## 2021-12-07 DIAGNOSIS — G4733 Obstructive sleep apnea (adult) (pediatric): Secondary | ICD-10-CM

## 2021-12-07 NOTE — Progress Notes (Addendum)
[facility] [address] [place]. [place] [address] [phone]       OFFICE FOLLOW UP NOTE  Mark Curry Date of Birth:  05/28/1986 Medical Record Number:  [id number]   Reason for visit: Initial CPAP follow-up    SUBJECTIVE:   CHIEF COMPLAINT:  Chief Complaint  Patient presents with   Obstructive Sleep Apnea    RM 3 alone Pt is well and stable, no concerns with CPAP     HPI:   Update 12/06/2021 JM: Patient returns for initial CPAP compliance visit.  Completed nocturnal polysomnography on 09/22/2021 which showed mild OSA at 10.1/h although severe severe REM AHI 62.3/h and severe supine REM sleep related desats with O2 nadir 78%.  Recommended initiating AutoPap and possible CPAP titration if indicated.   Review of CPAP compliance as below showing excellent usage although residual AHI 13.1/h (central 0.0, obstructive 8.5).  He does endorse some difficulty sleeping on his side due to his mask with leaks or shift of mask while on side. This has been causing some lower back pain sleeping supine all night.  He otherwise tolerates mask well.  He was unable to tolerate nasal pillows.  He does note more restful sleep and feeling more energized in the morning but will still have fatigue towards the afternoon. He does work long hours and travels 1.5 hours to work each day therefore only sleeps 5-6 hours per night, plans on moving to [place] [employer] next month which will help reduce commute time.  Epworth Sleepiness Scale 12/24.  Fatigue severity scale 52/63.        Consult visit 08/26/2021 Dr. Rexene Curry (copy for reference purposes only) [name] is a 36 year old right-handed gentleman with an underlying medical history of hyperlipidemia, history of ITP, COVID infection treated with Paxlovid in 2022, and severe obesity with a BMI of over 40, who reports snoring and excessive daytime somnolence.  I reviewed the office note from 03/30/2021.  His Epworth sleepiness score is 15 out  of 24, fatigue severity score is 33 out of 63.  He does get sleepy while driving, he has a long commute to and from work of about 75 to 90 minutes each way.  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  He has had weight gain especially after he was treated with 2 rounds of prednisone for his ITP.  He is not aware of any family history of sleep apnea.  Rise time is around 430, bedtime between 9 and 10 PM.  He has 2 younger children, and a 39-year-old daughter does not sleep well through the night although the 45-month-old is a good sleeper.  He lives with his wife and 2 girls, they have no pets in the household.  He has never dozed off at the wheel.  He is a non-smoker and does not drink any alcohol currently.  He drinks caffeine in the form of coffee, 1 in the morning and 1 in the afternoon.  He has had rare morning headaches, no night to night nocturia.  He does use a retainer, he has used nasal breathing strips which are marginally helpful in reducing his snoring, snoring is loud and disturbs his wife.        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 ([facility])    No pertinent past medical history  PSH:  Past Surgical History:  Procedure Laterality Date   TONSILLECTOMY      Social History:  Social History   Socioeconomic History   Marital status: Married    Spouse name: Not on file   Number of children: Not on file   Years of education: Not on file   Highest education level: Not on file  Occupational History   Not on file  Tobacco Use   Smoking status: Never   Smokeless tobacco: Never  Vaping Use   Vaping Use: Never used  Substance and Sexual Activity   Alcohol use: Not Currently   Drug use: Never   Sexual activity: Yes  Other Topics Concern   Not on file  Social History Narrative   Not on file   Social Determinants of  Health   Financial Resource Strain: Not on file  Food Insecurity: Not on file  Transportation Needs: Not on file  Physical Activity: Not on file  Stress: Not on file  Social Connections: Not on file  Intimate Partner Violence: Not on file    Family History:  Family History  Problem Relation Age of Onset   Polycystic ovary syndrome Mother    Diabetes Father    Insulin resistance Sister    Allergic rhinitis Brother    Multiple sclerosis Maternal Grandmother    Angioedema Neg Hx    Asthma Neg Hx    Eczema Neg Hx    Immunodeficiency Neg Hx    Urticaria Neg Hx    Sleep apnea Neg Hx     Medications:   No current outpatient medications on file prior to visit.   No current facility-administered medications on file prior to visit.    Allergies:  No Known Allergies    OBJECTIVE:  Physical Exam  Vitals:   12/07/21 0816  BP: (!) 145/84  Pulse: 76  Weight: 300 lb (136.1 kg)  Height: 6' (1.829 m)   Body mass index is 40.69 kg/m. No results found.  General: well developed, well nourished, very pleasant middle-aged Caucasian male, seated, in no evident distress Head: head normocephalic and atraumatic.   Neck: supple with no carotid or supraclavicular bruits Cardiovascular: regular rate and rhythm, no murmurs Musculoskeletal: no deformity Skin:  no rash/petichiae Vascular:  Normal pulses all extremities   Neurologic Exam Mental Status: Awake and fully alert. Oriented to place and time. Recent and remote memory intact. Attention span, concentration and fund of knowledge appropriate. Mood and affect appropriate.  Cranial Nerves: Pupils equal, briskly reactive to light. Extraocular movements full without nystagmus. Visual fields full to confrontation. Hearing intact. Facial sensation intact. Face, tongue, palate moves normally and symmetrically.  Motor: Normal bulk and tone. Normal strength in all tested extremity muscles Sensory.: intact to touch , pinprick , position and  vibratory sensation.  Coordination: Rapid alternating movements normal in all extremities. Finger-to-nose and heel-to-shin performed accurately bilaterally. Gait and Station: Arises from chair without difficulty. Stance is normal. Gait demonstrates normal stride length and balance without use of AD. Tandem walk and heel toe without difficulty.  Reflexes: 1+ and symmetric. Toes downgoing.         ASSESSMENT: Mark Curry is a 36 y.o. year old male with new diagnosis of mild OSA although severe REM OSA and REM sleep related desaturations.  CPAP initiated 10/14/2021     PLAN:  OSA on CPAP : Compliance report shows excellent compliance although elevated AHI at 13.1/h.  will adjust pressure settings from 6-13 to 6-15. Will pull  download report in 2 weeks to see if improved. If AHI increases after adjustment, may need to consider titration study at that time.  Discussed importance of weight loss to help with apnea.  Discussed ensuring he has good supportive mattress to help with low back pain and can look in to CPAP pillows as this may help improve side sleeping comfort.    ADDENDUM 5/10: Repeat download 5/9 with new pressure settings of 6-15 over the past 2 weeks with 100% compliance showed residual AHI 11.8. Pressure in the 95 percentile 14.3. will further adjust AutoPap pressure settings from 6-15 to 8-17 to see if we can get further control of AHI. Will plan on repeat download end of this month. If AHI remains elevated, will then proceed with titration study  ADDENDUM 6/1: Review of compliance report over the past 14 days shows excellent usage and improved residual AHI at 5.8 on AutoPap pressures of 7-17 with EPR level 3.  We will continue current pressures at this time and will plan on follow-up as scheduled.   Follow up in 6 months or call earlier if needed   CC:  PCP: Kathyrn Drown, MD    I spent 28 minutes of face-to-face and non-face-to-face time with patient.  This included  previsit chart review, lab review, study review, order entry, electronic health record documentation, patient education regarding diagnosis of sleep apnea with review and discussion of compliance report and answered all other questions to patient's satisfaction   Frann Rider, [facility]  [facility] [address] [place], [place] [address]  Phone [phone] Fax [phone] Note: This document was prepared with digital dictation and possible smart phrase technology. Any transcriptional errors that result from this process are unintentional.

## 2021-12-07 NOTE — Telephone Encounter (Signed)
DME order has sent to [facility] with instructions to adjust autopap setting to 6-15. Confirmation received.  ?

## 2022-01-29 ENCOUNTER — Ambulatory Visit
Admission: EM | Admit: 2022-01-29 | Discharge: 2022-01-29 | Disposition: A | Discharge status: Home / Self Care | Payer: BC Managed Care – PPO

## 2022-01-29 ENCOUNTER — Other Ambulatory Visit: Payer: Self-pay

## 2022-01-29 ENCOUNTER — Encounter: Payer: Self-pay | Admitting: Emergency Medicine

## 2022-01-29 DIAGNOSIS — B349 Viral infection, unspecified: Secondary | ICD-10-CM | POA: Diagnosis not present

## 2022-01-29 DIAGNOSIS — R112 Nausea with vomiting, unspecified: Secondary | ICD-10-CM | POA: Diagnosis not present

## 2022-01-29 DIAGNOSIS — R509 Fever, unspecified: Secondary | ICD-10-CM | POA: Diagnosis not present

## 2022-01-29 MED ORDER — ONDANSETRON 4 MG PO TBDP: 4.0000 mg | ORAL_TABLET | Freq: Three times a day (TID) | ORAL | 0 refills | Status: DC | PRN

## 2022-01-29 MED ORDER — ACETAMINOPHEN 325 MG PO TABS
650.0000 mg | ORAL_TABLET | Freq: Once | ORAL | Status: AC
  Administered 2022-01-29: 650 mg via ORAL

## 2022-01-29 MED ORDER — ONDANSETRON 4 MG PO TBDP
4.0000 mg | ORAL_TABLET | Freq: Once | ORAL | Status: AC
  Administered 2022-01-29: 4 mg via ORAL

## 2022-01-29 NOTE — Discharge Instructions (Addendum)
Take the antinausea medication as directed.  Take Tylenol for fever.  Keep yourself hydrated with clear liquids, such as water and Gatorade.    Go to the emergency department if you have acute worsening symptoms.    Follow up with your primary care provider if your symptoms are not improving.

## 2022-01-29 NOTE — ED Triage Notes (Addendum)
Onset 1:00 am of vomiting.  Patient has vomited 5 times since onset.  Generalized body aches.  Had chills.  Family members have had stomach virus  Had a root canal on Tuesday this week.  Ibuprofen around 1:15 this morning

## 2022-01-29 NOTE — ED Provider Notes (Signed)
[name]    CSN: [id number] Arrival date & time: 01/29/22  1236      History   Chief Complaint Chief Complaint  Patient presents with   Nausea    Also vomiting - Entered by patient    HPI ERSKIN Curry is a 36 y.o. male.   Accompanied by his wife, patient presents with nausea and vomiting since 0100 this morning.  He also reports fever, chills, body aches.  No diarrhea, abdominal pain, dysuria, cough, chest pain, shortness of breath, or other symptoms.  Treatment at home with ibuprofen last take at 0115 this morning.  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  Patient had a root canal on 01/25/2022; he reports mild "soreness" at the site but no acute pain; no drainage from the site.  The history is provided by the patient, the spouse and medical records.    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 Authorizing Provider  amoxicillin (AMOXIL) 500 MG capsule Take by mouth. 01/25/22   [provider]  ondansetron (ZOFRAN-ODT) 4 MG disintegrating tablet Take 1 tablet (4 mg total) by mouth every 8 (eight) hours as needed for nausea or vomiting. 01/29/22   Mickie Bail, NP    Family History Family History  Problem Relation Age of Onset   Polycystic ovary syndrome Mother    Diabetes Father    Insulin resistance  Sister    Allergic rhinitis Brother    Multiple sclerosis Maternal Grandmother    Angioedema Neg Hx    Asthma Neg Hx    Eczema Neg Hx    Immunodeficiency Neg Hx    Urticaria Neg Hx    Sleep apnea Neg Hx     Social History Social History   Tobacco Use   Smoking status: Never   Smokeless tobacco: Never  Vaping Use   Vaping Use: Never used  Substance Use Topics   Alcohol use: Not Currently   Drug use: Never     Allergies   Patient has no known allergies.   Review of Systems Review of Systems  Constitutional:  Positive for chills and fever.  HENT:  Negative for ear pain and sore throat.   Respiratory:  Negative for cough and shortness of breath.   Cardiovascular:  Negative for chest pain and palpitations.  Gastrointestinal:  Positive for nausea and vomiting. Negative for abdominal pain, constipation and diarrhea.  Genitourinary:  Negative for dysuria and hematuria.  Skin:  Negative for color change and rash.  All other systems reviewed and are negative.    Physical Exam Triage Vital Signs ED Triage Vitals [01/29/22 1247]  Enc Vitals Group     BP      Pulse      Resp      Temp      Temp src  SpO2      Weight      Height      Head Circumference      Peak Flow      Pain Score 8     Pain Loc      Pain Edu?      Excl. in GC?    No data found.  Updated Vital Signs BP 113/69 (BP Location: Left Arm) Comment (BP Location): large cuff  Pulse (!) 108   Temp (!) 102.6 F (39.2 C) (Oral)   Resp (!) 22   SpO2 95%   Visual Acuity Right Eye Distance:   Left Eye Distance:   Bilateral Distance:    Right Eye Near:   Left Eye Near:    Bilateral Near:     Physical Exam Vitals and nursing note reviewed.  Constitutional:      General: He is not in acute distress.    Appearance: He is well-developed. He is obese. He is ill-appearing.  HENT:     Mouth/Throat:     Mouth: Mucous membranes are moist.     Pharynx: Oropharynx is clear.     Comments: Site of  root canal appears to be healing well.  No edema or drainage.  Cardiovascular:     Rate and Rhythm: Regular rhythm. Tachycardia present.     Heart sounds: Normal heart sounds.  Pulmonary:     Effort: Pulmonary effort is normal. No respiratory distress.     Breath sounds: Normal breath sounds.  Abdominal:     General: Bowel sounds are normal.     Palpations: Abdomen is soft.     Tenderness: There is no abdominal tenderness. There is no guarding or rebound.  Musculoskeletal:     Cervical back: Neck supple.  Skin:    General: Skin is warm and dry.  Neurological:     Mental Status: He is alert.  Psychiatric:        Mood and Affect: Mood normal.        Behavior: Behavior normal.      UC Treatments / Results  Labs (all labs ordered are listed, but only abnormal results are displayed) Labs Reviewed - No data to display  EKG   Radiology No results found.  Procedures Procedures (including critical care time)  Medications Ordered in UC Medications  ondansetron (ZOFRAN-ODT) disintegrating tablet 4 mg (4 mg Oral Given 01/29/22 1311)  acetaminophen (TYLENOL) tablet 650 mg (650 mg Oral Given 01/29/22 1325)    Initial Impression / Assessment and Plan / UC Course  I have reviewed the triage vital signs and the nursing notes.  Pertinent labs & imaging results that were available during my care of the patient were reviewed by me and considered in my medical decision making (see chart for details).   Fever, nausea and vomiting, viral illness.  Patient declines transfer to the ED.  Zofran and Tylenol given here; patient drinking Gatorade without emesis.  He reports he is feeling better.  Treating nausea and vomiting with Zofran.  Discussed clear liquid diet.  Instructed patient to advance diet as tolerated.  ED precautions discussed.  Education provided on nausea and vomiting, fever, viral illness.  Instructed patient to follow-up with his PCP as needed.  He agrees to plan of care.     Final Clinical Impressions(s) / UC Diagnoses   Final diagnoses:  Fever, unspecified  Nausea and vomiting, unspecified vomiting type  Viral illness     Discharge Instructions  Take the antinausea medication as directed.  Take Tylenol for fever.  Keep yourself hydrated with clear liquids, such as water and Gatorade.    Go to the emergency department if you have acute worsening symptoms.    Follow up with your primary care provider if your symptoms are not improving.          ED Prescriptions     Medication Sig Dispense Auth. Provider   ondansetron (ZOFRAN-ODT) 4 MG disintegrating tablet  (Status: Discontinued) Take 1 tablet (4 mg total) by mouth every 8 (eight) hours as needed for nausea or vomiting. 20 tablet Mickie Bail, NP   ondansetron (ZOFRAN-ODT) 4 MG disintegrating tablet Take 1 tablet (4 mg total) by mouth every 8 (eight) hours as needed for nausea or vomiting. 20 tablet Mickie Bail, NP      PDMP not reviewed this encounter.   Mickie Bail, NP 01/29/22 1436

## 2022-01-31 ENCOUNTER — Encounter: Payer: Self-pay | Admitting: Family Medicine

## 2022-01-31 NOTE — Telephone Encounter (Signed)
Nurses Please order CBC-patient can get this through any [facility] drawing station  Please notify Zaul that this is been ordered  The CBC will look at platelet levels.  He is correct that vomiting can often cause these type of hemorrhages to occur because of increased blood pressure within the capillary system within the head with intense vomiting.  With his history of ITP it is reasonable to look at platelet levels.  Please order test Please notify Bradyn When the results come in we can also send him our interpretation as well  Should he notice more bruising or other problems it would be very wise to do in person office visit Thanks-Dr. Lorin Picket

## 2022-02-01 ENCOUNTER — Other Ambulatory Visit: Payer: Self-pay | Admitting: *Deleted

## 2022-02-01 DIAGNOSIS — D696 Thrombocytopenia, unspecified: Secondary | ICD-10-CM

## 2022-02-02 LAB — CBC WITH DIFFERENTIAL/PLATELET
Basophils Absolute: 0 10*3/uL (ref 0.0–0.2)
Basos: 1 %
EOS (ABSOLUTE): 0.2 10*3/uL (ref 0.0–0.4)
Eos: 6 %
Hematocrit: 44.6 % (ref 37.5–51.0)
Hemoglobin: 15 g/dL (ref 13.0–17.7)
Immature Grans (Abs): 0 10*3/uL (ref 0.0–0.1)
Immature Granulocytes: 0 %
Lymphocytes Absolute: 1.5 10*3/uL (ref 0.7–3.1)
Lymphs: 36 %
MCH: 28.2 pg (ref 26.6–33.0)
MCHC: 33.6 g/dL (ref 31.5–35.7)
MCV: 84 fL (ref 79–97)
Monocytes Absolute: 0.3 10*3/uL (ref 0.1–0.9)
Monocytes: 7 %
Neutrophils Absolute: 2 10*3/uL (ref 1.4–7.0)
Neutrophils: 50 %
Platelets: 178 10*3/uL (ref 150–450)
RBC: 5.31 x10E6/uL (ref 4.14–5.80)
RDW: 12.9 % (ref 11.6–15.4)
WBC: 4 10*3/uL (ref 3.4–10.8)

## 2022-05-31 ENCOUNTER — Ambulatory Visit: Payer: Self-pay | Admitting: Family Medicine

## 2022-05-31 NOTE — Progress Notes (Deleted)
[facility] [address] [place] [address] [phone]       OFFICE FOLLOW UP NOTE  Mark Curry Date of Birth:  [birth year] Medical Record Number:  [id number]   Reason for visit: CPAP follow-up    Virtual Visit via Video Note  I connected with Mark Curry on 05/31/22 at  8:45 AM EDT by a video enabled telemedicine application and verified that I am speaking with the correct person using two identifiers.  Location: Patient: At home Provider: In office   I discussed the limitations of evaluation and management by telemedicine and the availability of in person appointments. The patient expressed understanding and agreed to proceed.     SUBJECTIVE:   CHIEF COMPLAINT:  CPAP follow-up   HPI:   Update 06/01/2022 JM: Patient returns for 35-month CPAP compliance visit.  AutoPap settings required adjustment after prior visit due to elevated AHI and eventually saw improvement on AutoPap pressure of 7-17, currently visually AHI 4.7.          History provided for reference purposes only Update 12/06/2021 JM: Patient returns for initial CPAP compliance visit.  Completed nocturnal polysomnography on 09/22/2021 which showed mild OSA at 10.1/h although severe severe REM AHI 62.3/h and severe supine REM sleep related desats with O2 nadir 78%.  Recommended initiating AutoPap and possible CPAP titration if indicated.   Review of CPAP compliance as below showing excellent usage although residual AHI 13.1/h (central 0.0, obstructive 8.5).  He does endorse some difficulty sleeping on his side due to his mask with leaks or shift of mask while on side. This has been causing some lower back pain sleeping supine all night.  He otherwise tolerates mask well.  He was unable to tolerate nasal pillows.  He does note more restful sleep and feeling more energized in the morning but will still have fatigue towards the afternoon. He does work long hours and travels 1.5  hours to work each day therefore only sleeps 5-6 hours per night, plans on moving to [place] next month which will help reduce commute time.  Epworth Sleepiness Scale 12/24.  Fatigue severity scale 52/63.        Consult visit 08/26/2021 Dr. Frances Furbish (copy for reference purposes only) Mark Curry is a 36 year old right-handed gentleman with an underlying medical history of hyperlipidemia, history of ITP, COVID infection treated with Paxlovid in 2022, and severe obesity with a BMI of over 40, who reports snoring and excessive daytime somnolence.  I reviewed the office note from 03/30/2021.  His Epworth sleepiness score is 15 out of 24, fatigue severity score is 33 out of 63.  He does get sleepy while driving, he has a long commute to and from work of about 75 to 90 minutes each way.  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  He has had weight gain especially after he was treated with 2 rounds of prednisone for his ITP.  He is not aware of any family history of sleep apnea.  Rise time is around 430, bedtime between 9 and 10 PM.  He has 2 younger children, and a 34-year-old daughter does not sleep well through the night although the 71-month-old is a good sleeper.  He lives with his wife and 2 girls, they have no pets in the household.  He has never dozed off at the wheel.  He is a non-smoker and does not drink any alcohol currently.  He drinks caffeine in the form of  coffee, 1 in the morning and 1 in the afternoon.  He has had rare morning headaches, no night to night nocturia.  He does use a retainer, he has used nasal breathing strips which are marginally helpful in reducing his snoring, snoring is loud and disturbs his wife.        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 ([place])    No pertinent past medical history     PSH:  Past Surgical  History:  Procedure Laterality Date   TONSILLECTOMY      Social History:  Social History   Socioeconomic History   Marital status: Married    Spouse name: Not on file   Number of children: Not on file   Years of education: Not on file   Highest education level: Not on file  Occupational History   Not on file  Tobacco Use   Smoking status: Never   Smokeless tobacco: Never  Vaping Use   Vaping Use: Never used  Substance and Sexual Activity   Alcohol use: Not Currently   Drug use: Never   Sexual activity: Yes  Other Topics Concern   Not on file  Social History Narrative   Not on file   Social Determinants of Health   Financial Resource Strain: Not on file  Food Insecurity: Not on file  Transportation Needs: Not on file  Physical Activity: Not on file  Stress: Not on file  Social Connections: Not on file  Intimate Partner Violence: Not on file    Family History:  Family History  Problem Relation Age of Onset   Polycystic ovary syndrome Mother    Diabetes Father    Insulin resistance Sister    Allergic rhinitis Brother    Multiple sclerosis Maternal Grandmother    Angioedema Neg Hx    Asthma Neg Hx    Eczema Neg Hx    Immunodeficiency Neg Hx    Urticaria Neg Hx    Sleep apnea Neg Hx     Medications:   Current Outpatient Medications on File Prior to Visit  Medication Sig Dispense Refill   amoxicillin (AMOXIL) 500 MG capsule Take by mouth.     ondansetron (ZOFRAN-ODT) 4 MG disintegrating tablet Take 1 tablet (4 mg total) by mouth every 8 (eight) hours as needed for nausea or vomiting. 20 tablet 0   No current facility-administered medications on file prior to visit.    Allergies:  No Known Allergies    OBJECTIVE:  Physical Exam  There were no vitals filed for this visit.  There is no height or weight on file to calculate BMI. No results found.  General: well developed, well nourished, very pleasant middle-aged Caucasian male, seated, in no  evident distress Head: head normocephalic and atraumatic.   Neck: supple with no carotid or supraclavicular bruits Cardiovascular: regular rate and rhythm, no murmurs Musculoskeletal: no deformity Skin:  no rash/petichiae Vascular:  Normal pulses all extremities   Neurologic Exam Mental Status: Awake and fully alert. Oriented to place and time. Recent and remote memory intact. Attention span, concentration and fund of knowledge appropriate. Mood and affect appropriate.  Cranial Nerves: Pupils equal, briskly reactive to light. Extraocular movements full without nystagmus. Visual fields full to confrontation. Hearing intact. Facial sensation intact. Face, tongue, palate moves normally and symmetrically.  Motor: Normal bulk and tone. Normal strength  in all tested extremity muscles Sensory.: intact to touch , pinprick , position and vibratory sensation.  Coordination: Rapid alternating movements normal in all extremities. Finger-to-nose and heel-to-shin performed accurately bilaterally. Gait and Station: Arises from chair without difficulty. Stance is normal. Gait demonstrates normal stride length and balance without use of AD. Tandem walk and heel toe without difficulty.  Reflexes: 1+ and symmetric. Toes downgoing.         ASSESSMENT: Mark Curry is a 36 y.o. year old male with new diagnosis of mild OSA although severe REM OSA and REM sleep related desaturations.  CPAP initiated 10/14/2021     PLAN:  OSA on CPAP : Compliance report shows excellent compliance although elevated AHI at 13.1/h.  will adjust pressure settings from 6-13 to 6-15. Will pull download report in 2 weeks to see if improved. If AHI increases after adjustment, may need to consider titration study at that time.  Discussed importance of weight loss to help with apnea.  Discussed ensuring he has good supportive mattress to help with low back pain and can look in to CPAP pillows as this may help improve side sleeping  comfort.    ADDENDUM 5/10: Repeat download 5/9 with new pressure settings of 6-15 over the past 2 weeks with 100% compliance showed residual AHI 11.8. Pressure in the 95 percentile 14.3. will further adjust AutoPap pressure settings from 6-15 to 8-17 to see if we can get further control of AHI. Will plan on repeat download end of this month. If AHI remains elevated, will then proceed with titration study  ADDENDUM 6/1: Review of compliance report over the past 14 days shows excellent usage and improved residual AHI at 5.8 on AutoPap pressures of 7-17 with EPR level 3.  We will continue current pressures at this time and will plan on follow-up as scheduled.   Follow up in 6 months or call earlier if needed   CC:  PCP: Babs Sciara, MD    I spent 28 minutes of face-to-face and non-face-to-face time with patient.  This included previsit chart review, lab review, study review, order entry, electronic health record documentation, patient education regarding diagnosis of sleep apnea with review and discussion of compliance report and answered all other questions to patient's satisfaction   Mark Curry, [facility]  [facility] [address] [place], [place] [address]  Phone [phone] Fax [phone] Note: This document was prepared with digital dictation and possible smart phrase technology. Any transcriptional errors that result from this process are unintentional.

## 2022-06-01 ENCOUNTER — Telehealth: Payer: BC Managed Care – PPO | Admitting: Adult Health

## 2022-06-01 ENCOUNTER — Telehealth: Payer: Self-pay | Admitting: Adult Health

## 2022-06-01 DIAGNOSIS — Z0289 Encounter for other administrative examinations: Secondary | ICD-10-CM

## 2022-06-01 NOTE — Telephone Encounter (Signed)
Called pt at 8:35 am to remind of MyChart Video Visit. Pt said need to cancel appt due to child at home sick. Transferred to Billing to pay no show fee.

## 2022-06-01 NOTE — Telephone Encounter (Signed)
..   Pt understands that although there may be some limitations with this type of visit, we will take all precautions to reduce any security or privacy concerns.  Pt understands that this will be treated like an in office visit and we will file with pt's insurance, and there may be a patient responsible charge related to this service. ? ?

## 2022-06-02 NOTE — Progress Notes (Signed)
[facility] [address] [place]. [place] [address] [phone]       OFFICE FOLLOW UP NOTE  [name] Date of Birth:  19-Jun-1986 Medical Record Number:  [id number]   Reason for visit: CPAP follow-up    Virtual Visit via Video Note  I connected with Mark Curry on 06/02/22 at  8:15 AM EDT by a video enabled telemedicine application and verified that I am speaking with the correct person using two identifiers.  Location: Patient: At home Provider: In office   I discussed the limitations of evaluation and management by telemedicine and the availability of in person appointments. The patient expressed understanding and agreed to proceed.     SUBJECTIVE:   CHIEF COMPLAINT:  CPAP follow-up   HPI:   Update 06/01/2022 JM: Patient returns for 70-month CPAP compliance visit.  AutoPap settings required adjustment after prior visit due to elevated AHI and eventually saw improvement on AutoPap pressure of 7-17, currently residual AHI 4.7.  tolerating CPAP well, continues to feel refreshed upon awakening, good energy levels during the day.  Is up-to-date with supplies.  No concerns today.          History provided for reference purposes only Update 12/06/2021 JM: Patient returns for initial CPAP compliance visit.  Completed nocturnal polysomnography on 09/22/2021 which showed mild OSA at 10.1/h although severe severe REM AHI 62.3/h and severe supine REM sleep related desats with O2 nadir 78%.  Recommended initiating AutoPap and possible CPAP titration if indicated.   Review of CPAP compliance as below showing excellent usage although residual AHI 13.1/h (central 0.0, obstructive 8.5).  He does endorse some difficulty sleeping on his side due to his mask with leaks or shift of mask while on side. This has been causing some lower back pain sleeping supine all night.  He otherwise tolerates mask well.  He was unable to tolerate nasal pillows.  He does note  more restful sleep and feeling more energized in the morning but will still have fatigue towards the afternoon. He does work long hours and travels 1.5 hours to work each day therefore only sleeps 5-6 hours per night, plans on moving to [place] next month which will help reduce commute time.  Epworth Sleepiness Scale 12/24.  Fatigue severity scale 52/63.        Consult visit 08/26/2021 Dr. Rexene Alberts (copy for reference purposes only) Mark Curry is a 36 year old right-handed gentleman with an underlying medical history of hyperlipidemia, history of ITP, COVID infection treated with Paxlovid in 2022, and severe obesity with a BMI of over 40, who reports snoring and excessive daytime somnolence.  I reviewed the office note from 03/30/2021.  His Epworth sleepiness score is 15 out of 24, fatigue severity score is 33 out of 63.  He does get sleepy while driving, he has a long commute to and from work of about 75 to 90 minutes each way.  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  He has had weight gain especially after he was treated with 2 rounds of prednisone for his ITP.  He is not aware of any family history of sleep apnea.  Rise time is around 430, bedtime between 9 and 10 PM.  He has 2 younger children, and a 64-year-old daughter does not sleep well through the night although the 3-month-old is a good sleeper.  He lives with his wife and 2 girls, they have no pets in the household.  He has never  dozed off at the wheel.  He is a non-smoker and does not drink any alcohol currently.  He drinks caffeine in the form of coffee, 1 in the morning and 1 in the afternoon.  He has had rare morning headaches, no night to night nocturia.  He does use a retainer, he has used nasal breathing strips which are marginally helpful in reducing his snoring, snoring is loud and disturbs his wife.        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 ([facility])    No pertinent past medical history     PSH:  Past Surgical History:  Procedure Laterality Date   TONSILLECTOMY      Social History:  Social History   Socioeconomic History   Marital status: Married    Spouse name: Not on file   Number of children: Not on file   Years of education: Not on file   Highest education level: Not on file  Occupational History   Not on file  Tobacco Use   Smoking status: Never   Smokeless tobacco: Never  Vaping Use   Vaping Use: Never used  Substance and Sexual Activity   Alcohol use: Not Currently   Drug use: Never   Sexual activity: Yes  Other Topics Concern   Not on file  Social History Narrative   Not on file   Social Determinants of Health   Financial Resource Strain: Not on file  Food Insecurity: Not on file  Transportation Needs: Not on file  Physical Activity: Not on file  Stress: Not on file  Social Connections: Not on file  Intimate Partner Violence: Not on file    Family History:  Family History  Problem Relation Age of Onset   Polycystic ovary syndrome Mother    Diabetes Father    Insulin resistance Sister    Allergic rhinitis Brother    Multiple sclerosis Maternal Grandmother    Angioedema Neg Hx    Asthma Neg Hx    Eczema Neg Hx    Immunodeficiency Neg Hx    Urticaria Neg Hx    Sleep apnea Neg Hx     Medications:   Current Outpatient Medications on File Prior to Visit  Medication Sig Dispense Refill   amoxicillin (AMOXIL) 500 MG capsule Take by mouth.     ondansetron (ZOFRAN-ODT) 4 MG disintegrating tablet Take 1 tablet (4 mg total) by mouth every 8 (eight) hours as needed for nausea or vomiting. 20 tablet 0   No current facility-administered medications on file prior to visit.    Allergies:  No Known Allergies    OBJECTIVE:  Physical Exam N/A d/t visit type        ASSESSMENT: Mark Curry is a 36  y.o. year old male with new diagnosis of mild OSA although severe REM OSA and REM sleep related desaturations.  CPAP initiated 10/14/2021     PLAN:  OSA on CPAP : Compliance report shows excellent compliance and optimal residual AHI.  Continue current pressure settings at this time.  Discussed importance of continued nightly usage with ensuring greater than 4 hours per night for optimal benefit and for insurance requirements.  Continue to follow with DME company for any needed supplies or CPAP related concerns.    Follow up in 1 year or call earlier if needed   CC:  PCP: Babs Sciara, MD    I spent 20 minutes of face-to-face and non-face-to-face time with patient.  This included previsit chart review, lab review, study review, order entry, electronic health record documentation, patient education regarding sleep apnea with review and discussion of compliance report and answered all other questions to patient's satisfaction   Ihor Austin, [facility]  [facility] [address] [place], [place] [address]  Phone [phone] Fax [phone] Note: This document was prepared with digital dictation and possible smart phrase technology. Any transcriptional errors that result from this process are unintentional.

## 2022-06-06 ENCOUNTER — Telehealth: Payer: BC Managed Care – PPO | Admitting: Adult Health

## 2022-06-06 ENCOUNTER — Encounter: Payer: Self-pay | Admitting: Adult Health

## 2022-06-06 DIAGNOSIS — G4733 Obstructive sleep apnea (adult) (pediatric): Secondary | ICD-10-CM | POA: Diagnosis not present

## 2022-06-06 NOTE — Progress Notes (Signed)
CPAP orders faxed to [facility], received confirmation.

## 2022-06-09 ENCOUNTER — Telehealth: Payer: BC Managed Care – PPO | Admitting: Adult Health

## 2022-07-03 ENCOUNTER — Ambulatory Visit (INDEPENDENT_AMBULATORY_CARE_PROVIDER_SITE_OTHER): Payer: BC Managed Care – PPO

## 2022-07-03 ENCOUNTER — Ambulatory Visit
Admission: EM | Admit: 2022-07-03 | Discharge: 2022-07-03 | Disposition: A | Discharge status: Home / Self Care | Payer: BC Managed Care – PPO | Attending: Emergency Medicine | Admitting: Emergency Medicine

## 2022-07-03 DIAGNOSIS — R058 Other specified cough: Secondary | ICD-10-CM | POA: Diagnosis not present

## 2022-07-03 DIAGNOSIS — J209 Acute bronchitis, unspecified: Secondary | ICD-10-CM

## 2022-07-03 MED ORDER — BENZONATATE 100 MG PO CAPS: 100.0000 mg | ORAL_CAPSULE | Freq: Three times a day (TID) | ORAL | 0 refills | Status: DC | PRN

## 2022-07-03 MED ORDER — AZITHROMYCIN 250 MG PO TABS: 250.0000 mg | ORAL_TABLET | Freq: Every day | ORAL | 0 refills | Status: DC

## 2022-07-03 NOTE — ED Provider Notes (Signed)
Mark Curry    CSN: [id number] Arrival date & time: 07/03/22  9767      History   Chief Complaint Chief Complaint  Patient presents with   Cough    HPI Mark Curry is a 36 y.o. male.  Patient presents with productive cough x >1 week.  He reports he had a televisit at another urgent care on 06/25/2022 and was treated with Augmentin (7 days) and methylprednisolone.  He denies fever, rash, shortness of breath, chest pain, vomiting, diarrhea, or other symptoms.  His medical history includes obesity.    The history is provided by the patient and medical records.    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 Authorizing Provider  azithromycin (ZITHROMAX) 250 MG tablet Take 1 tablet (250 mg total) by mouth daily. Take first 2 tablets together, then 1 every day until finished. 07/03/22  Yes Mickie Bail, NP  benzonatate (TESSALON) 100 MG capsule Take 1 capsule (100 mg total) by mouth 3 (three) times daily as needed for cough. 07/03/22  Yes Mickie Bail, NP  ondansetron (ZOFRAN-ODT) 4 MG disintegrating tablet Take 1 tablet (4 mg total) by mouth every 8 (eight) hours as needed for nausea or vomiting. 01/29/22   Mickie Bail, NP    Family History Family History  Problem Relation Age of Onset   Polycystic ovary syndrome Mother    Diabetes Father    Insulin resistance Sister    Allergic rhinitis Brother     Multiple sclerosis Maternal Grandmother    Angioedema Neg Hx    Asthma Neg Hx    Eczema Neg Hx    Immunodeficiency Neg Hx    Urticaria Neg Hx    Sleep apnea Neg Hx     Social History Social History   Tobacco Use   Smoking status: Never   Smokeless tobacco: Never  Vaping Use   Vaping Use: Never used  Substance Use Topics   Alcohol use: Not Currently   Drug use: Never     Allergies   Patient has no known allergies.   Review of Systems Review of Systems  Constitutional:  Negative for chills and fever.  HENT:  Negative for ear pain and sore throat.   Respiratory:  Positive for cough. Negative for shortness of breath.   Cardiovascular:  Negative for chest pain and palpitations.  Gastrointestinal:  Negative for abdominal pain, diarrhea and vomiting.  Skin:  Negative for color change and rash.  All other systems reviewed and are negative.    Physical Exam Triage Vital Signs ED Triage Vitals  Enc Vitals Group     BP      Pulse      Resp      Temp      Temp src      SpO2      Weight  Height      Head Circumference      Peak Flow      Pain Score      Pain Loc      Pain Edu?      Excl. in GC?    No data found.  Updated Vital Signs BP 138/84   Pulse 87   Temp 98.1 F (36.7 C)   Resp 18   Ht 5\' 11"  (1.803 m)   Wt 290 lb (131.5 kg)   SpO2 96%   BMI 40.45 kg/m   Visual Acuity Right Eye Distance:   Left Eye Distance:   Bilateral Distance:    Right Eye Near:   Left Eye Near:    Bilateral Near:     Physical Exam Vitals and nursing note reviewed.  Constitutional:      General: He is not in acute distress.    Appearance: Normal appearance. He is well-developed. He is not ill-appearing.  HENT:     Right Ear: Tympanic membrane normal.     Left Ear: Tympanic membrane normal.     Nose: Nose normal.     Mouth/Throat:     Mouth: Mucous membranes are moist.     Pharynx: Oropharynx is clear.  Cardiovascular:     Rate and Rhythm: Normal rate and  regular rhythm.     Heart sounds: Normal heart sounds.  Pulmonary:     Effort: Pulmonary effort is normal. No respiratory distress.     Breath sounds: Rhonchi present.     Comments: Left side rhonchi. Musculoskeletal:     Cervical back: Neck supple.  Skin:    General: Skin is warm and dry.  Neurological:     Mental Status: He is alert.  Psychiatric:        Mood and Affect: Mood normal.        Behavior: Behavior normal.      UC Treatments / Results  Labs (all labs ordered are listed, but only abnormal results are displayed) Labs Reviewed - No data to display  EKG   Radiology DG Chest 2 View  Result Date: 07/03/2022 CLINICAL DATA:  Productive cough, on antibiotics EXAM: CHEST - 2 VIEW COMPARISON:  09/19/2019 FINDINGS: Low volume chest. There is no edema, consolidation, effusion, or pneumothorax. Normal heart size and mediastinal contours. IMPRESSION: Negative for pneumonia. Electronically Signed   By: 11/17/2019 M.D.   On: 07/03/2022 09:15    Procedures Procedures (including critical care time)  Medications Ordered in UC Medications - No data to display  Initial Impression / Assessment and Plan / UC Course  I have reviewed the triage vital signs and the nursing notes.  Pertinent labs & imaging results that were available during my care of the patient were reviewed by me and considered in my medical decision making (see chart for details).    Productive cough, acute bronchitis.  CXR negative. Patient completed Augmentin this morning and also took course of methylprednisolone.  Treating today with Zithromax and Tessalon Perles.  Instructed patient to follow up with his PCP if his symptoms are not improving.  He agrees to plan of care.    Final Clinical Impressions(s) / UC Diagnoses   Final diagnoses:  Productive cough  Acute bronchitis, unspecified organism     Discharge Instructions      Take the Zithromax and Tessalon Perles as directed.  Follow up with  your primary care provider if your symptoms are not improving.  ED Prescriptions     Medication Sig Dispense Auth. Provider   azithromycin (ZITHROMAX) 250 MG tablet Take 1 tablet (250 mg total) by mouth daily. Take first 2 tablets together, then 1 every day until finished. 6 tablet Mickie Bail, NP   benzonatate (TESSALON) 100 MG capsule Take 1 capsule (100 mg total) by mouth 3 (three) times daily as needed for cough. 21 capsule Mickie Bail, NP      PDMP not reviewed this encounter.   Mickie Bail, NP 07/03/22 [phone]

## 2022-07-03 NOTE — ED Triage Notes (Signed)
Patient to Urgent Care with complaints of  persistent cough.   Symptoms started 1 week ago. Seen at an urgent care last Saturday and prescribed amoxicillin and methylprednisolone. Completed course of medication as prescribed. Reports over all feeling better but still has productive cough with greenish/ beige sputum.  Denies any recent fevers.

## 2022-07-03 NOTE — Discharge Instructions (Addendum)
Take the Zithromax and Tessalon Perles as directed.    Follow up with your primary care provider if your symptoms are not improving.     

## 2022-08-04 ENCOUNTER — Ambulatory Visit
Admission: EM | Admit: 2022-08-04 | Discharge: 2022-08-04 | Disposition: A | Discharge status: Home / Self Care | Payer: BC Managed Care – PPO | Attending: Emergency Medicine | Admitting: Emergency Medicine

## 2022-08-04 DIAGNOSIS — J069 Acute upper respiratory infection, unspecified: Secondary | ICD-10-CM

## 2022-08-04 DIAGNOSIS — R052 Subacute cough: Secondary | ICD-10-CM

## 2022-08-04 MED ORDER — PREDNISONE 10 MG (21) PO TBPK: ORAL_TABLET | Freq: Every day | ORAL | 0 refills | Status: DC

## 2022-08-04 MED ORDER — PROMETHAZINE-DM 6.25-15 MG/5ML PO SYRP: 5.0000 mL | ORAL_SOLUTION | Freq: Four times a day (QID) | ORAL | 0 refills | Status: DC | PRN

## 2022-08-04 MED ORDER — ALBUTEROL SULFATE HFA 108 (90 BASE) MCG/ACT IN AERS: 1.0000 | INHALATION_SPRAY | Freq: Four times a day (QID) | RESPIRATORY_TRACT | 0 refills | Status: DC | PRN

## 2022-08-04 NOTE — Discharge Instructions (Addendum)
Take the prednisone and use the albuterol inhaler as directed.    Take the promethazine DM as directed.  Do not drive, operate machinery, drink alcohol, or perform dangerous activities while taking this medication as it may cause drowsiness.  Follow up with your primary care provider.

## 2022-08-04 NOTE — ED Provider Notes (Signed)
Mark Curry    CSN: [id number] Arrival date & time: 08/04/22  0818      History   Chief Complaint Chief Complaint  Patient presents with   Chest Congestion    HPI Mark Curry is a 36 y.o. male.  Patient presents with chest congestion and cough x 6 weeks; worse last night.  His cough kept him awake.  No fever, shortness of breath, chest pain, or other symptoms.  No OTC medications taken today.  Patient was seen here on 07/03/2022; diagnosed with productive cough and acute bronchitis; CXR negative; treated with Zithromax and Tessalon.  He reports a televisit at another urgent care on 06/25/2022 and was treated with Augmentin and methylprednisolone.   The history is provided by the patient and medical records.    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 Authorizing Provider  albuterol (VENTOLIN HFA) 108 (90 Base) MCG/ACT inhaler Inhale 1-2 puffs into the lungs every 6 (six) hours as needed. 08/04/22  Yes Mickie Bail, NP  predniSONE (STERAPRED UNI-PAK 21 TAB) 10 MG (21) TBPK tablet Take by mouth daily. As directed 08/04/22  Yes Mickie Bail, NP  promethazine-dextromethorphan (PROMETHAZINE-DM) 6.25-15 MG/5ML syrup Take 5 mLs by mouth 4 (four) times daily as needed. 08/04/22  Yes Mickie Bail, NP  ondansetron (ZOFRAN-ODT) 4 MG disintegrating tablet Take 1 tablet (4 mg total) by mouth every 8 (eight) hours as  needed for nausea or vomiting. 01/29/22   Mickie Bail, NP    Family History Family History  Problem Relation Age of Onset   Polycystic ovary syndrome Mother    Diabetes Father    Insulin resistance Sister    Allergic rhinitis Brother    Multiple sclerosis Maternal Grandmother    Angioedema Neg Hx    Asthma Neg Hx    Eczema Neg Hx    Immunodeficiency Neg Hx    Urticaria Neg Hx    Sleep apnea Neg Hx     Social History Social History   Tobacco Use   Smoking status: Never   Smokeless tobacco: Never  Vaping Use   Vaping Use: Never used  Substance Use Topics   Alcohol use: Not Currently   Drug use: Never     Allergies   Patient has no known allergies.   Review of Systems Review of Systems  Constitutional:  Negative for chills and fever.  HENT:  Positive for congestion. Negative for ear pain and sore throat.   Respiratory:  Positive for cough. Negative for shortness of breath.   Cardiovascular:  Negative for chest pain and palpitations.  Gastrointestinal:  Negative for diarrhea and vomiting.  Skin:  Negative for color change and rash.  All other systems reviewed and are negative.    Physical Exam Triage Vital Signs ED Triage Vitals  Enc Vitals Group     BP  Pulse      Resp      Temp      Temp src      SpO2      Weight      Height      Head Circumference      Peak Flow      Pain Score      Pain Loc      Pain Edu?      Excl. in GC?    No data found.  Updated Vital Signs BP 131/85   Pulse 77   Temp 98.6 F (37 C)   Resp 18   Ht 6' (1.829 m)   Wt 300 lb (136.1 kg)   SpO2 95%   BMI 40.69 kg/m   Visual Acuity Right Eye Distance:   Left Eye Distance:   Bilateral Distance:    Right Eye Near:   Left Eye Near:    Bilateral Near:     Physical Exam Vitals and nursing note reviewed.  Constitutional:      General: He is not in acute distress.    Appearance: He is well-developed. He is not ill-appearing.  HENT:     Right Ear: Tympanic  membrane normal.     Left Ear: Tympanic membrane normal.     Nose: Nose normal.     Mouth/Throat:     Mouth: Mucous membranes are moist.     Pharynx: Oropharynx is clear.     Comments: Clear PND. Cardiovascular:     Rate and Rhythm: Normal rate and regular rhythm.     Heart sounds: Normal heart sounds.  Pulmonary:     Effort: Pulmonary effort is normal. No respiratory distress.     Breath sounds: Normal breath sounds. No wheezing, rhonchi or rales.  Musculoskeletal:     Cervical back: Neck supple.  Skin:    General: Skin is warm and dry.  Neurological:     Mental Status: He is alert.  Psychiatric:        Mood and Affect: Mood normal.        Behavior: Behavior normal.      UC Treatments / Results  Labs (all labs ordered are listed, but only abnormal results are displayed) Labs Reviewed - No data to display  EKG   Radiology No results found.  Procedures Procedures (including critical care time)  Medications Ordered in UC Medications - No data to display  Initial Impression / Assessment and Plan / UC Course  I have reviewed the triage vital signs and the nursing notes.  Pertinent labs & imaging results that were available during my care of the patient were reviewed by me and considered in my medical decision making (see chart for details).   Subacute cough, viral URI.  Treating with albuterol inhaler, prednisone, promethazine DM.  Precautions for drowsiness with promethazine discussed.  Instructed him to schedule an appointment with his PCP; he has an appointment on January 2 already scheduled.  He agrees to plan of care.    Final Clinical Impressions(s) / UC Diagnoses   Final diagnoses:  Subacute cough  Viral URI     Discharge Instructions      Take the prednisone and use the albuterol inhaler as directed.    Take the promethazine DM as directed.  Do not drive, operate machinery, drink alcohol, or perform dangerous activities while taking this medication  as it may cause drowsiness.  Follow up with your primary care provider.  ED Prescriptions     Medication Sig Dispense Auth. Provider   albuterol (VENTOLIN HFA) 108 (90 Base) MCG/ACT inhaler Inhale 1-2 puffs into the lungs every 6 (six) hours as needed. 18 g Mickie Bail, NP   predniSONE (STERAPRED UNI-PAK 21 TAB) 10 MG (21) TBPK tablet Take by mouth daily. As directed 21 tablet Mickie Bail, NP   promethazine-dextromethorphan (PROMETHAZINE-DM) 6.25-15 MG/5ML syrup Take 5 mLs by mouth 4 (four) times daily as needed. 118 mL Mickie Bail, NP      PDMP not reviewed this encounter.   Mickie Bail, NP 08/04/22 [phone]

## 2022-08-04 NOTE — ED Triage Notes (Signed)
Patient to Urgent Care with complaints of chest congestion x6 weeks. Symptoms worsened last night. Patient was recently treated for bronchitis with some improvement in symptoms but reports cough never completely resolved.  Reports constant productive cough- beige colored mucus.  Denies any recent fevers.

## 2022-08-12 NOTE — Progress Notes (Signed)
I,Joseline E Rosas,acting as a scribe for [employer], MD.,have documented all relevant documentation on the behalf of Ronnald Ramp, MD,as directed by  Ronnald Ramp, MD while in the presence of Ronnald Ramp, MD.  New patient visit   Patient: Mark Curry   DOB: Oct 22, 1985   36 y.o. Male  MRN: [id number] Visit Date: 08/16/2022  Today's healthcare provider: Ronnald Ramp, MD   Chief Complaint  Patient presents with   Establish Care   Subjective    Mark Curry is a 36 y.o. male who presents today as a new patient to establish care. Previous PCP Dr. 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)    No pertinent past medical history    Sleep apnea    Past Surgical History:  Procedure Laterality Date   TONSILLECTOMY     Family Status  Relation Name Status   Mother  Alive   Father  Alive   Sister  (Not Specified)   Brother  (Not Specified)   MGM  (Not Specified)   Neg Hx  (Not Specified)   Family History  Problem Relation Age of Onset   Depression Mother    Polycystic ovary syndrome Mother    Diabetes Father    Insulin resistance Sister    Allergic rhinitis Brother    Multiple sclerosis Maternal Grandmother    Angioedema Neg Hx    Asthma Neg Hx    Eczema Neg Hx    Immunodeficiency Neg Hx    Urticaria Neg Hx    Sleep apnea Neg Hx    Social History   Socioeconomic History   Marital status: Married    Spouse name: Not on file   Number of children: Not on file    Years of education: Not on file   Highest education level: Not on file  Occupational History   Not on file  Tobacco Use   Smoking status: Never   Smokeless tobacco: Never  Vaping Use   Vaping Use: Never used  Substance and Sexual Activity   Alcohol use: Not Currently    Alcohol/week: 1.0 standard drink of alcohol    Types: 1 Cans of beer per week   Drug use: Never   Sexual activity: Yes  Other Topics Concern   Not on file  Social History Narrative   Not on file   Social Determinants of Health   Financial Resource Strain: Not on file  Food Insecurity: Not on file  Transportation Needs: Not on file  Physical Activity: Not on file  Stress: Not on file  Social Connections: Not on file   Outpatient Medications Prior to Visit  Medication Sig   [DISCONTINUED] albuterol (VENTOLIN HFA) 108 (90 Base) MCG/ACT inhaler Inhale 1-2 puffs into the lungs every 6 (six) hours as needed.   [DISCONTINUED] ondansetron (ZOFRAN-ODT) 4 MG disintegrating tablet Take 1 tablet (4 mg total) by mouth every 8 (eight) hours as needed for nausea or vomiting.   [DISCONTINUED] predniSONE (STERAPRED UNI-PAK 21 TAB) 10 MG (21) TBPK tablet Take by mouth  daily. As directed   [DISCONTINUED] promethazine-dextromethorphan (PROMETHAZINE-DM) 6.25-15 MG/5ML syrup Take 5 mLs by mouth 4 (four) times daily as needed.   No facility-administered medications prior to visit.   No Known Allergies  Immunization History  Administered Date(s) Administered   Influenza,inj,Quad PF,6+ Mos 06/08/2019   Influenza-Unspecified 06/30/2017, 05/27/2018   Tdap 08/16/2022    Health Maintenance  Topic Date Due   COVID-19 Vaccine (1) Never done   Hepatitis C Screening  Never done   INFLUENZA VACCINE  11/13/2022 (Originally 03/15/2022)   DTaP/Tdap/Td (2 - Td or Tdap) 08/16/2032   HIV Screening  Completed   HPV VACCINES  Aged Out    Patient Care Team: Ronnald Ramp, MD as PCP - General (Family Medicine) Jonelle Sidle, MD as PCP - Cardiology (Cardiology)  Review of Systems     Objective    BP 124/84 (BP Location: Left Arm, Patient Position: Sitting, Cuff Size: Large)   Pulse 90   Temp 98.7 F (37.1 C) (Oral)   Resp 16   Ht 5\' 11"  (1.803 m)   Wt (!) 314 lb (142.4 kg)   BMI 43.79 kg/m    Physical Exam Vitals reviewed.  Constitutional:      General: He is not in acute distress.    Appearance: Normal appearance. He is not ill-appearing, toxic-appearing or diaphoretic.  Eyes:     Conjunctiva/sclera: Conjunctivae normal.  Cardiovascular:     Rate and Rhythm: Normal rate and regular rhythm.     Pulses: Normal pulses.     Heart sounds: Normal heart sounds. No murmur heard.    No friction rub. No gallop.  Pulmonary:     Effort: Pulmonary effort is normal. No respiratory distress.     Breath sounds: Normal breath sounds. No stridor. No wheezing, rhonchi or rales.  Abdominal:     General: Bowel sounds are normal. There is no distension.     Palpations: Abdomen is soft.     Tenderness: There is no abdominal tenderness.  Musculoskeletal:     Right lower leg: No edema.     Left lower leg: No edema.  Skin:    Findings: No erythema or rash.  Neurological:     Mental Status: He is alert and oriented to person, place, and time.     Depression Screen    08/16/2022    9:16 AM 03/30/2021    8:28 AM 08/30/2017    8:21 AM  PHQ 2/9 Scores  PHQ - 2 Score 0 0 0  PHQ- 9 Score 0     No results found for any visits on 08/16/22.  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)    Stable  No recent bleeding or skin changes  No current medications for this  Will check CBC at physical         Other   Encounter to establish care - Primary    Welcomed patient to practice Reviewed patient's medical history Reviewed patient's medications Reviewed patient surgical and social history Discussed roles  and expectations primary care physician-patient relationship         Weight gain    Patient reports weight gain and concern for personal and family hx of pre-diabetes  We discussed incorporating small and frequent exercise into daily routine as well as focusing on increasing the amount of healthy foods rather than taking away foods  Will follow up at CPE          Return in about 3 months (around 11/15/2022) for CPE.     I, Ronnald Ramp, MD, have reviewed all documentation for this visit.  Portions of this information were initially documented by the CMA and reviewed by me for thoroughness and accuracy.      Ronnald Ramp, MD  [facility] [phone] (phone) [phone] (fax)  [facility]

## 2022-08-16 ENCOUNTER — Ambulatory Visit (INDEPENDENT_AMBULATORY_CARE_PROVIDER_SITE_OTHER): Payer: BC Managed Care – PPO | Admitting: Family Medicine

## 2022-08-16 ENCOUNTER — Encounter: Payer: Self-pay | Admitting: Family Medicine

## 2022-08-16 ENCOUNTER — Ambulatory Visit: Payer: Self-pay | Admitting: Family Medicine

## 2022-08-16 VITALS — BP 124/84 | HR 90 | Temp 98.7°F | Resp 16 | Ht 71.0 in | Wt 314.0 lb

## 2022-08-16 DIAGNOSIS — Z7689 Persons encountering health services in other specified circumstances: Secondary | ICD-10-CM

## 2022-08-16 DIAGNOSIS — R635 Abnormal weight gain: Secondary | ICD-10-CM | POA: Diagnosis not present

## 2022-08-16 DIAGNOSIS — Z23 Encounter for immunization: Secondary | ICD-10-CM | POA: Diagnosis not present

## 2022-08-16 DIAGNOSIS — D693 Immune thrombocytopenic purpura: Secondary | ICD-10-CM

## 2022-08-16 NOTE — Assessment & Plan Note (Signed)
Patient reports weight gain and concern for personal and family hx of pre-diabetes  We discussed incorporating small and frequent exercise into daily routine as well as focusing on increasing the amount of healthy foods rather than taking away foods  Will follow up at CPE

## 2022-08-16 NOTE — Assessment & Plan Note (Signed)
Welcomed patient to practice Reviewed patient's medical history Reviewed patient's medications Reviewed patient surgical and social history Discussed roles and expectations primary care physician-patient relationship    

## 2022-08-16 NOTE — Assessment & Plan Note (Signed)
Stable  No recent bleeding or skin changes  No current medications for this  Will check CBC at physical

## 2022-09-14 NOTE — Progress Notes (Signed)
I,Joseline E Rosas,acting as a scribe for [employer], MD.,have documented all relevant documentation on the behalf of [name], MD,as directed by  [name], MD while in the presence of [name], MD.   Established patient visit   Patient: Mark Curry   DOB: 12-16-1985   37 y.o. Male  MRN: [id number] Visit Date: 09/15/2022  Today's healthcare provider: [name], MD   Chief Complaint  Patient presents with   Sore Throat   Subjective    Sore Throat  This is a new problem. The current episode started yesterday. The problem has been unchanged. There has been no fever. Associated symptoms include coughing, headaches and trouble swallowing. Pertinent negatives include no ear discharge, ear pain or shortness of breath. He has had exposure to strep. He has tried acetaminophen for the symptoms. The treatment provided no relief.      Medications: No outpatient medications prior to visit.   No facility-administered medications prior to visit.    Review of Systems  HENT:  Positive for sore throat and trouble swallowing. Negative for ear discharge, ear pain, facial swelling, rhinorrhea, sinus pressure, sinus pain and sneezing.   Respiratory:  Positive for cough. Negative for shortness of breath and wheezing.   Neurological:  Positive for headaches.       Objective    BP 124/78 (BP Location: Left Arm, Patient Position: Sitting, Cuff Size: Normal)   Pulse 74   Temp 98.7 F (37.1 C) (Oral)   Resp 14   Wt (!) 311 lb 11.2 oz (141.4 kg)   BMI 43.47 kg/m    Physical Exam HENT:     Head: No raccoon eyes, right periorbital erythema or left periorbital erythema.     Right Ear: Hearing normal. No decreased hearing noted. No drainage or tenderness. A middle ear effusion is present. There is no impacted cerumen. Tympanic membrane is not perforated, erythematous or bulging.     Left Ear: Hearing normal. No  decreased hearing noted. No drainage or tenderness. A middle ear effusion is present. There is no impacted cerumen. Tympanic membrane is not perforated, erythematous or bulging.     Nose: Congestion present. No nasal tenderness.     Right Turbinates: Enlarged.     Left Turbinates: Not enlarged.     Right Sinus: No maxillary sinus tenderness or frontal sinus tenderness.     Left Sinus: No maxillary sinus tenderness or frontal sinus tenderness.  Cardiovascular:     Rate and Rhythm: Normal rate and regular rhythm.  Pulmonary:     Effort: Pulmonary effort is normal. No respiratory distress.     Breath sounds: Normal breath sounds. No wheezing.       Results for orders placed or performed in visit on 09/15/22  POCT rapid strep A  Result Value Ref Range   Rapid Strep A Screen Negative Negative    Assessment & Plan     Problem List Items Addressed This Visit       Other   Exposure to strep throat   Relevant Medications   amoxicillin (AMOXIL) 500 MG capsule   Other Relevant Orders   POCT rapid strep A (Completed)   Sore throat - Primary    ACUTE problem  Patient with sore throat and pain with swallowing  Exposure to 2 members in household with strep pharyngitis  Will test for strep  Will treat empirically given exposure  Prescribed amoxil 1000mg  daily for 10 days  RTC PRN  Patient declines  work note and wishes to return 09/16/22       Relevant Medications   amoxicillin (AMOXIL) 500 MG capsule   Other Relevant Orders   POCT rapid strep A (Completed)     Return if symptoms worsen or fail to improve.        The entirety of the information documented in the History of Present Illness, Review of Systems and Physical Exam were personally obtained by me. Portions of this information were initially documented by Lyndel Pleasure, CMA and reviewed by me for thoroughness and accuracy.[name], MD     [name], MD  [facility] [phone] (phone) [phone] (fax)  [place]

## 2022-09-15 ENCOUNTER — Encounter: Payer: Self-pay | Admitting: Family Medicine

## 2022-09-15 ENCOUNTER — Ambulatory Visit: Payer: BC Managed Care – PPO | Admitting: Family Medicine

## 2022-09-15 VITALS — BP 124/78 | HR 74 | Temp 98.7°F | Resp 14 | Wt 311.7 lb

## 2022-09-15 DIAGNOSIS — Z20818 Contact with and (suspected) exposure to other bacterial communicable diseases: Secondary | ICD-10-CM

## 2022-09-15 DIAGNOSIS — J029 Acute pharyngitis, unspecified: Secondary | ICD-10-CM | POA: Diagnosis not present

## 2022-09-15 LAB — POCT RAPID STREP A (OFFICE): Rapid Strep A Screen: NEGATIVE

## 2022-09-15 MED ORDER — AMOXICILLIN 500 MG PO CAPS: 1000.0000 mg | ORAL_CAPSULE | Freq: Every day | ORAL | 0 refills | Status: AC

## 2022-09-16 ENCOUNTER — Encounter: Payer: Self-pay | Admitting: Family Medicine

## 2022-09-16 DIAGNOSIS — J029 Acute pharyngitis, unspecified: Secondary | ICD-10-CM | POA: Insufficient documentation

## 2022-09-16 DIAGNOSIS — Z20818 Contact with and (suspected) exposure to other bacterial communicable diseases: Secondary | ICD-10-CM | POA: Insufficient documentation

## 2022-09-16 NOTE — Assessment & Plan Note (Signed)
ACUTE problem  Patient with sore throat and pain with swallowing  Exposure to 2 members in household with strep pharyngitis  Will test for strep  Will treat empirically given exposure  Prescribed amoxil 1000mg  daily for 10 days  RTC PRN  Patient declines work note and wishes to return 09/16/22

## 2022-11-01 LAB — HM COLONOSCOPY

## 2022-11-14 NOTE — Progress Notes (Unsigned)
I,Mark Curry,acting as a scribe for [employer], MD.,have documented all relevant documentation on the behalf of Mark Foster, MD,as directed by  Mark Foster, MD while in the presence of Mark Foster, MD.   Complete physical exam   Patient: Mark Curry   DOB: [birth year]   37 y.o. Male  MRN: [id number] Visit Date: 11/15/2022  Today's healthcare provider: [name], MD   Chief Complaint  Patient presents with   Annual Exam   Subjective    Mark Curry is a 37 y.o. male who presents today for a complete physical exam.   He reports consuming a general diet. Home exercise routine includes walking 1 hrs per 2-3 times a week.  He generally feels well. He reports sleeping well with the cpap on nightly basis, states that he fells well rested in the mornings.  He does not have additional problems to discuss today.    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    No pertinent past medical history    Sleep apnea    Past Surgical History:  Procedure Laterality Date   TONSILLECTOMY     Social History   Socioeconomic History   Marital status: Married    Spouse name: Not on file   Number of children: Not on file   Years of education: Not on file   Highest education level: Doctorate  Occupational History   Not on file  Tobacco Use   Smoking status: Never   Smokeless tobacco: Never  Vaping Use   Vaping Use: Never used  Substance and Sexual Activity   Alcohol use: Not Currently    Alcohol/week: 1.0 standard drink of alcohol    Types: 1 Cans of beer per week   Drug use: Never   Sexual activity: Yes  Other Topics Concern   Not on file  Social History Narrative   Not on file   Social Determinants of Health   Financial Resource Strain: Low Risk  (11/14/2022)   Overall Financial Resource Strain (CARDIA)    Difficulty of Paying Living Expenses: Not hard at  all  Food Insecurity: No Food Insecurity (11/14/2022)   Hunger Vital Sign    Worried About Running Out of Food in the Last Year: Never true    Ran Out of Food in the Last Year: Never true  Transportation Needs: No Transportation Needs (11/14/2022)   PRAPARE - Hydrologist (Medical): No    Lack of Transportation (Non-Medical): No  Physical Activity: Insufficiently Active (11/14/2022)   Exercise Vital Sign    Days of Exercise per Week: 2 days    Minutes of Exercise per Session: 60 min  Stress: Stress Concern Present (11/14/2022)   [place]    Feeling of Stress : To some extent  Social Connections: Moderately Isolated (11/14/2022)   Social Connection and Isolation Panel [NHANES]    Frequency of Communication with Friends and Family: Once a week    Frequency of Social Gatherings with Friends and Family: Once a week    Attends Religious Services: More than 4 times per year    Active Member of [employer] or Organizations: No    Attends Music therapist: Not on file    Marital Status: Married  Human resources officer Violence: Not on file   Family Status  Relation Name Status   Mother  Alive  Father  Alive   Sister  (Not Specified)   Brother  (Not Specified)   MGM  (Not Specified)   Neg Hx  (Not Specified)   Family History  Problem Relation Age of Onset   Depression Mother    Polycystic ovary syndrome Mother    Diabetes Father    Insulin resistance Sister    Allergic rhinitis Brother    Multiple sclerosis Maternal Grandmother    Angioedema Neg Hx    Asthma Neg Hx    Eczema Neg Hx    Immunodeficiency Neg Hx    Urticaria Neg Hx    Sleep apnea Neg Hx    No Known Allergies  Patient Care Team: Mark Foster, MD as PCP - General (Family Medicine) Satira Sark, MD as PCP - Cardiology (Cardiology)   Medications: No outpatient medications prior to visit.   No  facility-administered medications prior to visit.    Review of Systems  HENT:  Positive for congestion.   Respiratory:  Positive for apnea.   Gastrointestinal:  Positive for diarrhea.  Musculoskeletal:  Positive for back pain.  Allergic/Immunologic: Positive for immunocompromised state.  All other systems reviewed and are negative.     Objective    BP 129/84 (BP Location: Left Arm, Patient Position: Sitting, Cuff Size: Large)   Pulse 93   Temp 98.6 F (37 C) (Oral)   Resp 16   Ht 6' (1.829 m)   Wt (!) 302 lb 9.6 oz (137.3 kg)   BMI 41.04 kg/m     Physical Exam Vitals reviewed.  Constitutional:      General: He is not in acute distress.    Appearance: Normal appearance. He is not ill-appearing, toxic-appearing or diaphoretic.  HENT:     Head: Normocephalic and atraumatic.     Right Ear: External ear normal.     Left Ear: External ear normal.     Nose: Nose normal.     Mouth/Throat:     Mouth: Mucous membranes are moist.     Pharynx: No oropharyngeal exudate or posterior oropharyngeal erythema.  Eyes:     General: No scleral icterus.    Extraocular Movements: Extraocular movements intact.     Conjunctiva/sclera: Conjunctivae normal.     Pupils: Pupils are equal, round, and reactive to light.  Neck:     Vascular: No carotid bruit.  Cardiovascular:     Rate and Rhythm: Normal rate and regular rhythm.     Pulses: Normal pulses.     Heart sounds: Normal heart sounds. No murmur heard.    No friction rub. No gallop.  Pulmonary:     Effort: Pulmonary effort is normal. No respiratory distress.     Breath sounds: Normal breath sounds. No stridor. No wheezing, rhonchi or rales.  Abdominal:     General: Bowel sounds are normal. There is no distension.     Palpations: Abdomen is soft. There is no mass.     Tenderness: There is no abdominal tenderness. There is no guarding.  Musculoskeletal:        General: No swelling, tenderness or signs of injury. Normal range of  motion.     Cervical back: Normal range of motion and neck supple. No rigidity or tenderness.     Right lower leg: No edema.     Left lower leg: No edema.  Lymphadenopathy:     Cervical: No cervical adenopathy.  Skin:    General: Skin is warm and dry.     Capillary Refill:  Capillary refill takes less than 2 seconds.     Findings: No erythema or rash.  Neurological:     General: No focal deficit present.     Mental Status: He is alert and oriented to person, place, and time.     Cranial Nerves: Cranial nerves 2-12 are intact.     Sensory: Sensation is intact.     Motor: Motor function is intact. No weakness, tremor or abnormal muscle tone.     Gait: Gait normal.  Psychiatric:        Attention and Perception: Attention normal.        Mood and Affect: Mood normal.        Speech: Speech normal.        Behavior: Behavior normal. Behavior is cooperative.        Thought Content: Thought content normal.       Last depression screening scores    11/15/2022   10:08 AM 09/15/2022    1:48 PM 08/16/2022    9:16 AM  PHQ 2/9 Scores  PHQ - 2 Score 0 0 0  PHQ- 9 Score  1 0   Last fall risk screening    09/15/2022    1:48 PM  Fall Risk   Falls in the past year? 0  Number falls in past yr: 0  Injury with Fall? 0  Risk for fall due to : No Fall Risks   Last Audit-C alcohol use screening    11/14/2022   11:30 AM  Alcohol Use Disorder Test (AUDIT)  1. How often do you have a drink containing alcohol? 2  2. How many drinks containing alcohol do you have on a typical day when you are drinking? 0  3. How often do you have six or more drinks on one occasion? 0  AUDIT-C Score 2   A score of 3 or more in women, and 4 or more in men indicates increased risk for alcohol abuse, EXCEPT if all of the points are from question 1   No results found for any visits on 11/15/22.  Assessment & Plan    Routine Health Maintenance and Physical Exam   Immunization History  Administered Date(s)  Administered   Influenza,inj,Quad PF,6+ Mos 06/08/2019   Influenza-Unspecified 06/30/2017, 05/27/2018   MMR 05/23/2018   Tdap 08/16/2022    Health Maintenance  Topic Date Due   COVID-19 Vaccine (1) Never done   Hepatitis C Screening  Never done   INFLUENZA VACCINE  03/16/2023   DTaP/Tdap/Td (2 - Td or Tdap) 08/16/2032   HIV Screening  Completed   HPV VACCINES  Aged Out   Problem List Items Addressed This Visit       Respiratory   OSA on CPAP    Chronic  Symptoms well controlled with regular use of CPAP Continue nightly CPAP        Digestive   Fatty liver    Chronic  CMP collected today        Relevant Orders   Comprehensive metabolic panel   Lipid Profile     Hematopoietic and Hemostatic   Thrombocytopenia    Lab Results  Component Value Date   PLT 178 02/01/2022  Will recheck platelets today  CBC ordered  No rash, abnormal bruising        Relevant Orders   CBC     Other   Annual physical exam - Primary    Age appropriate vaccines recommended  Labs ordered including CMP, A1c, lipid panel, CBC,  hepatitis C screening  Follow up in 1 year for annual physical  Patient recently had colonoscopy, path results pending        Relevant Orders   Comprehensive metabolic panel   CBC   Lipid Profile   Hepatitis C Antibody   Hemoglobin A1c   Need for hepatitis C screening test    Hepatitis C screening ordered       Relevant Orders   Hepatitis C Antibody   Morbid obesity with BMI of 40.0-44.9, adult    Screening labs collected: A1c, lipid panel, CMP BMI 41.04 today  Encouraged increased vegetable intake and increasing physical activity       Relevant Orders   Hemoglobin A1c     Return in about 1 year (around 11/15/2023) for CPE.       The entirety of the information documented in the History of Present Illness, Review of Systems and Physical Exam were personally obtained by me. Portions of this information were initially documented by Lyndel Pleasure, CMA. I, Mark Foster, MD have reviewed the documentation above for thoroughness and accuracy.      Mark Foster, MD  [facility] [phone] (phone) [phone] (fax)  [place]

## 2022-11-15 ENCOUNTER — Telehealth: Payer: Self-pay | Admitting: Family Medicine

## 2022-11-15 ENCOUNTER — Ambulatory Visit (INDEPENDENT_AMBULATORY_CARE_PROVIDER_SITE_OTHER): Payer: BC Managed Care – PPO | Admitting: Family Medicine

## 2022-11-15 ENCOUNTER — Encounter: Payer: Self-pay | Admitting: Family Medicine

## 2022-11-15 VITALS — BP 129/84 | HR 93 | Temp 98.6°F | Resp 16 | Ht 72.0 in | Wt 302.6 lb

## 2022-11-15 DIAGNOSIS — Z0000 Encounter for general adult medical examination without abnormal findings: Secondary | ICD-10-CM | POA: Diagnosis not present

## 2022-11-15 DIAGNOSIS — D696 Thrombocytopenia, unspecified: Secondary | ICD-10-CM | POA: Diagnosis not present

## 2022-11-15 DIAGNOSIS — Z1159 Encounter for screening for other viral diseases: Secondary | ICD-10-CM | POA: Insufficient documentation

## 2022-11-15 DIAGNOSIS — J301 Allergic rhinitis due to pollen: Secondary | ICD-10-CM | POA: Insufficient documentation

## 2022-11-15 DIAGNOSIS — K760 Fatty (change of) liver, not elsewhere classified: Secondary | ICD-10-CM

## 2022-11-15 DIAGNOSIS — G4733 Obstructive sleep apnea (adult) (pediatric): Secondary | ICD-10-CM | POA: Insufficient documentation

## 2022-11-15 DIAGNOSIS — Z6841 Body Mass Index (BMI) 40.0 and over, adult: Secondary | ICD-10-CM | POA: Insufficient documentation

## 2022-11-15 NOTE — Addendum Note (Signed)
Addended by: Adolph Pollack on: 11/15/2022 05:03 PM   Modules accepted: Level of Service

## 2022-11-15 NOTE — Assessment & Plan Note (Signed)
Hepatitis-C screening ordered.

## 2022-11-15 NOTE — Assessment & Plan Note (Signed)
Screening labs collected: A1c, lipid panel, CMP BMI 41.04 today  Encouraged increased vegetable intake and increasing physical activity

## 2022-11-15 NOTE — Assessment & Plan Note (Signed)
Age appropriate vaccines recommended  Labs ordered including CMP, A1c, lipid panel, CBC, hepatitis C screening  Follow up in 1 year for annual physical  Patient recently had colonoscopy, path results pending

## 2022-11-15 NOTE — Assessment & Plan Note (Signed)
Lab Results  Component Value Date   PLT 178 02/01/2022   Will recheck platelets today  CBC ordered  No rash, abnormal bruising

## 2022-11-15 NOTE — Telephone Encounter (Signed)
Please review charges for DOS 08/16/22 and confirm correct.    Copied from [place] [id number]. Topic: Complaint - Billing/Coding >> Nov 01, 2022  3:49 PM Sabas Sous wrote: DOS: 08/16/2022 Details of complaint: Coding error  How would the patient like to see this issue resolved? Wants this reviewed and resubmitted   Best contact: [phone] Provider line   Route to Practice Administrator.

## 2022-11-15 NOTE — Assessment & Plan Note (Signed)
Chronic  Symptoms well controlled with regular use of CPAP Continue nightly CPAP

## 2022-11-15 NOTE — Assessment & Plan Note (Signed)
Chronic  CMP collected today

## 2022-11-15 NOTE — Patient Instructions (Signed)
Health Maintenance, Male Adopting a healthy lifestyle and getting preventive care are important in promoting health and wellness. Ask your health care provider about: The right schedule for you to have regular tests and exams. Things you can do on your own to prevent diseases and keep yourself healthy. What should I know about diet, weight, and exercise? Eat a healthy diet  Eat a diet that includes plenty of vegetables, fruits, low-fat dairy products, and lean protein. Do not eat a lot of foods that are high in solid fats, added sugars, or sodium. Maintain a healthy weight Body mass index (BMI) is a measurement that can be used to identify possible weight problems. It estimates body fat based on height and weight. Your health care provider can help determine your BMI and help you achieve or maintain a healthy weight. Get regular exercise Get regular exercise. This is one of the most important things you can do for your health. Most adults should: Exercise for at least 150 minutes each week. The exercise should increase your heart rate and make you sweat (moderate-intensity exercise). Do strengthening exercises at least twice a week. This is in addition to the moderate-intensity exercise. Spend less time sitting. Even light physical activity can be beneficial. Watch cholesterol and blood lipids Have your blood tested for lipids and cholesterol at 36 years of age, then have this test every 5 years. You may need to have your cholesterol levels checked more often if: Your lipid or cholesterol levels are high. You are older than 36 years of age. You are at high risk for heart disease. What should I know about cancer screening? Many types of cancers can be detected early and may often be prevented. Depending on your health history and family history, you may need to have cancer screening at various ages. This may include screening for: Colorectal cancer. Prostate cancer. Skin cancer. Lung  cancer. What should I know about heart disease, diabetes, and high blood pressure? Blood pressure and heart disease High blood pressure causes heart disease and increases the risk of stroke. This is more likely to develop in people who have high blood pressure readings or are overweight. Talk with your health care provider about your target blood pressure readings. Have your blood pressure checked: Every 3-5 years if you are 18-39 years of age. Every year if you are 40 years old or older. If you are between the ages of 65 and 75 and are a current or former smoker, ask your health care provider if you should have a one-time screening for abdominal aortic aneurysm (AAA). Diabetes Have regular diabetes screenings. This checks your fasting blood sugar level. Have the screening done: Once every three years after age 45 if you are at a normal weight and have a low risk for diabetes. More often and at a younger age if you are overweight or have a high risk for diabetes. What should I know about preventing infection? Hepatitis B If you have a higher risk for hepatitis B, you should be screened for this virus. Talk with your health care provider to find out if you are at risk for hepatitis B infection. Hepatitis C Blood testing is recommended for: Everyone born from 1945 through 1965. Anyone with known risk factors for hepatitis C. Sexually transmitted infections (STIs) You should be screened each year for STIs, including gonorrhea and chlamydia, if: You are sexually active and are younger than 36 years of age. You are older than 36 years of age and your   health care provider tells you that you are at risk for this type of infection. Your sexual activity has changed since you were last screened, and you are at increased risk for chlamydia or gonorrhea. Ask your health care provider if you are at risk. Ask your health care provider about whether you are at high risk for HIV. Your health care provider  may recommend a prescription medicine to help prevent HIV infection. If you choose to take medicine to prevent HIV, you should first get tested for HIV. You should then be tested every 3 months for as long as you are taking the medicine. Follow these instructions at home: Alcohol use Do not drink alcohol if your health care provider tells you not to drink. If you drink alcohol: Limit how much you have to 0-2 drinks a day. Know how much alcohol is in your drink. In the [place] one drink equals one 12 oz bottle of beer (355 mL), one 5 oz glass of wine (148 mL), or one 1 oz glass of hard liquor (44 mL). Lifestyle Do not use any products that contain nicotine or tobacco. These products include cigarettes, chewing tobacco, and vaping devices, such as e-cigarettes. If you need help quitting, ask your health care provider. Do not use street drugs. Do not share needles. Ask your health care provider for help if you need support or information about quitting drugs. General instructions Schedule regular health, dental, and eye exams. Stay current with your vaccines. Tell your health care provider if: You often feel depressed. You have ever been abused or do not feel safe at home. Summary Adopting a healthy lifestyle and getting preventive care are important in promoting health and wellness. Follow your health care provider's instructions about healthy diet, exercising, and getting tested or screened for diseases. Follow your health care provider's instructions on monitoring your cholesterol and blood pressure. This information is not intended to replace advice given to you by your health care provider. Make sure you discuss any questions you have with your health care provider. Document Revised: 12/21/2020 Document Reviewed: 12/21/2020 Elsevier Patient Education  2023 [employer] Inc[employer]  

## 2022-11-16 LAB — LIPID PANEL
Chol/HDL Ratio: 3.5 ratio (ref 0.0–5.0)
Cholesterol, Total: 132 mg/dL (ref 100–199)
HDL: 38 mg/dL — ABNORMAL LOW (ref >39)
LDL Chol Calc (NIH): 69 mg/dL (ref 0–99)
Triglycerides: 140 mg/dL (ref 0–149)
VLDL Cholesterol Cal: 25 mg/dL (ref 5–40)

## 2022-11-16 LAB — COMPREHENSIVE METABOLIC PANEL
ALT: 48 IU/L — ABNORMAL HIGH (ref 0–44)
AST: 33 IU/L (ref 0–40)
Albumin/Globulin Ratio: 1.7 (ref 1.2–2.2)
Albumin: 4.6 g/dL (ref 4.1–5.1)
Alkaline Phosphatase: 69 IU/L (ref 44–121)
BUN/Creatinine Ratio: 11 (ref 9–20)
BUN: 11 mg/dL (ref 6–20)
Bilirubin Total: 0.5 mg/dL (ref 0.0–1.2)
CO2: 22 mmol/L (ref 20–29)
Calcium: 9.4 mg/dL (ref 8.7–10.2)
Chloride: 106 mmol/L (ref 96–106)
Creatinine, Ser: 0.96 mg/dL (ref 0.76–1.27)
Globulin, Total: 2.7 g/dL (ref 1.5–4.5)
Glucose: 95 mg/dL (ref 70–99)
Potassium: 4 mmol/L (ref 3.5–5.2)
Sodium: 143 mmol/L (ref 134–144)
Total Protein: 7.3 g/dL (ref 6.0–8.5)
eGFR: 106 mL/min/{1.73_m2} (ref >59)

## 2022-11-16 LAB — CBC
Hematocrit: 45.9 % (ref 37.5–51.0)
Hemoglobin: 15.4 g/dL (ref 13.0–17.7)
MCH: 28.6 pg (ref 26.6–33.0)
MCHC: 33.6 g/dL (ref 31.5–35.7)
MCV: 85 fL (ref 79–97)
Platelets: 187 10*3/uL (ref 150–450)
RBC: 5.38 x10E6/uL (ref 4.14–5.80)
RDW: 12.8 % (ref 11.6–15.4)
WBC: 5.4 10*3/uL (ref 3.4–10.8)

## 2022-11-16 LAB — HEMOGLOBIN A1C
Est. average glucose Bld gHb Est-mCnc: 108 mg/dL
Hgb A1c MFr Bld: 5.4 % (ref 4.8–5.6)

## 2022-11-16 LAB — HEPATITIS C ANTIBODY: Hep C Virus Ab: NONREACTIVE

## 2022-12-29 ENCOUNTER — Encounter: Payer: Self-pay | Admitting: Family Medicine

## 2022-12-29 ENCOUNTER — Ambulatory Visit: Payer: BC Managed Care – PPO | Admitting: Family Medicine

## 2022-12-29 VITALS — BP 132/85 | HR 76 | Temp 98.8°F | Resp 16 | Wt 308.6 lb

## 2022-12-29 DIAGNOSIS — J0110 Acute frontal sinusitis, unspecified: Secondary | ICD-10-CM | POA: Diagnosis not present

## 2022-12-29 DIAGNOSIS — J029 Acute pharyngitis, unspecified: Secondary | ICD-10-CM | POA: Diagnosis not present

## 2022-12-29 LAB — POCT RAPID STREP A (OFFICE): Rapid Strep A Screen: NEGATIVE

## 2022-12-29 MED ORDER — AMOXICILLIN-POT CLAVULANATE 875-125 MG PO TABS: 1.0000 | ORAL_TABLET | Freq: Two times a day (BID) | ORAL | 0 refills | Status: DC

## 2022-12-29 NOTE — Patient Instructions (Signed)
I have sent in a prescription for your sinus infection to take twice daily for 10 days  Please follow up with me if you do not feel better after completing your antibiotics   Please make sure to hydrate as this medication can alter gut microbes and cause diarrhea   Sinus Infection, Adult A sinus infection is soreness and swelling (inflammation) of your sinuses. Sinuses are hollow spaces in the bones around your face. They are located: Around your eyes. In the middle of your forehead. Behind your nose. In your cheekbones. Your sinuses and nasal passages are lined with a fluid called mucus. Mucus drains out of your sinuses. Swelling can trap mucus in your sinuses. This lets germs (bacteria, virus, or fungus) grow, which leads to infection. Most of the time, this condition is caused by a virus. What are the causes? Allergies. Asthma. Germs. Things that block your nose or sinuses. Growths in the nose (nasal polyps). Chemicals or irritants in the air. A fungus. This is rare. What increases the risk? Having a weak body defense system (immune system). Doing a lot of swimming or diving. Using nasal sprays too much. Smoking. What are the signs or symptoms? The main symptoms of this condition are pain and a feeling of pressure around the sinuses. Other symptoms include: Stuffy nose (congestion). This may make it hard to breathe through your nose. Runny nose (drainage). Soreness, swelling, and warmth in the sinuses. A cough that may get worse at night. Being unable to smell and taste. Mucus that collects in the throat or the back of the nose (postnasal drip). This may cause a sore throat or bad breath. Being very tired (fatigued). A fever. How is this diagnosed? Your symptoms. Your medical history. A physical exam. Tests to find out if your condition is short-term (acute) or long-term (chronic). Your doctor may: Check your nose for growths (polyps). Check your sinuses using a tool that  has a light on one end (endoscope). Check for allergies or germs. Do imaging tests, such as an MRI or CT scan. How is this treated? Treatment for this condition depends on the cause and whether it is short-term or long-term. If caused by a virus, your symptoms should go away on their own within 10 days. You may be given medicines to relieve symptoms. They include: Medicines that shrink swollen tissue in the nose. A spray that treats swelling of the nostrils. Rinses that help get rid of thick mucus in your nose (nasal saline washes). Medicines that treat allergies (antihistamines). Over-the-counter pain relievers. If caused by bacteria, your doctor may wait to see if you will get better without treatment. You may be given antibiotic medicine if you have: A very bad infection. A weak body defense system. If caused by growths in the nose, surgery may be needed. Follow these instructions at home: Medicines Take, use, or apply over-the-counter and prescription medicines only as told by your doctor. These may include nasal sprays. If you were prescribed an antibiotic medicine, take it as told by your doctor. Do not stop taking it even if you start to feel better. Hydrate and humidify  Drink enough water to keep your pee (urine) pale yellow. Use a cool mist humidifier to keep the humidity level in your home above 50%. Breathe in steam for 10-15 minutes, 3-4 times a day, or as told by your doctor. You can do this in the bathroom while a hot shower is running. Try not to spend time in cool or dry  air. Rest Rest as much as you can. Sleep with your head raised (elevated). Make sure you get enough sleep each night. General instructions  Put a warm, moist washcloth on your face 3-4 times a day, or as often as told by your doctor. Use nasal saline washes as often as told by your doctor. Wash your hands often with soap and water. If you cannot use soap and water, use hand sanitizer. Do not smoke.  Avoid being around people who are smoking (secondhand smoke). Keep all follow-up visits. Contact a doctor if: You have a fever. Your symptoms get worse. Your symptoms do not get better within 10 days. Get help right away if: You have a very bad headache. You cannot stop vomiting. You have very bad pain or swelling around your face or eyes. You have trouble seeing. You feel confused. Your neck is stiff. You have trouble breathing. These symptoms may be an emergency. Get help right away. Call 911. Do not wait to see if the symptoms will go away. Do not drive yourself to the hospital. Summary A sinus infection is swelling of your sinuses. Sinuses are hollow spaces in the bones around your face. This condition is caused by tissues in your nose that become inflamed or swollen. This traps germs. These can lead to infection. If you were prescribed an antibiotic medicine, take it as told by your doctor. Do not stop taking it even if you start to feel better. Keep all follow-up visits. This information is not intended to replace advice given to you by your health care provider. Make sure you discuss any questions you have with your health care provider. Document Revised: 07/06/2021 Document Reviewed: 07/06/2021 Elsevier Patient Education  2023 [employer].

## 2022-12-29 NOTE — Progress Notes (Signed)
I,Joseline E Rosas,acting as a scribe for [employer], MD.,have documented all relevant documentation on the behalf of Ronnald Ramp, MD,as directed by  Ronnald Ramp, MD while in the presence of Ronnald Ramp, MD.   Established patient visit   Patient: Mark Curry   DOB: November 01, 1985   37 y.o. Male  MRN: [id number] Visit Date: 12/29/2022  Today's healthcare provider: Ronnald Ramp, MD   Chief Complaint  Patient presents with   Sore Throat   Subjective    HPI  Sore Throat: Patient complains of sore throat. Associated symptoms include post nasal drip, sinus and nasal congestion, sore throat, and lightheaded .Onset of symptoms was a few days ago, gradually worsening since that time. He is drinking plenty of fluids. He has not had recent close exposure to someone with proven streptococcal pharyngitis.    Medications: No outpatient medications prior to visit.   No facility-administered medications prior to visit.    Review of Systems  HENT:  Positive for congestion, sinus pain and sore throat. Negative for ear discharge and ear pain.   Neurological:  Positive for light-headedness.       Objective    BP 132/85 (BP Location: Left Arm, Patient Position: Sitting, Cuff Size: Large)   Pulse 76   Temp 98.8 F (37.1 C) (Oral)   Resp 16   Wt (!) 308 lb 9.6 oz (140 kg)   BMI 41.85 kg/m    Physical Exam Vitals reviewed.  Constitutional:      General: He is not in acute distress.    Appearance: Normal appearance. He is ill-appearing. He is not toxic-appearing or diaphoretic.  HENT:     Right Ear: No tenderness. A middle ear effusion is present. Tympanic membrane is bulging. Tympanic membrane is not perforated or erythematous.     Left Ear: No tenderness.  No middle ear effusion. Tympanic membrane is bulging. Tympanic membrane is not perforated or erythematous.     Mouth/Throat:     Lips: Pink.     Mouth: Mucous  membranes are moist. No oral lesions.     Pharynx: No oropharyngeal exudate or posterior oropharyngeal erythema.     Tonsils: No tonsillar exudate.  Eyes:     Conjunctiva/sclera: Conjunctivae normal.  Cardiovascular:     Rate and Rhythm: Normal rate and regular rhythm.     Pulses: Normal pulses.     Heart sounds: Normal heart sounds. No murmur heard.    No friction rub. No gallop.  Pulmonary:     Effort: Pulmonary effort is normal. No respiratory distress.     Breath sounds: Normal breath sounds. No stridor. No wheezing, rhonchi or rales.  Abdominal:     General: Bowel sounds are normal. There is no distension.     Palpations: Abdomen is soft.     Tenderness: There is no abdominal tenderness.  Musculoskeletal:     Right lower leg: No edema.     Left lower leg: No edema.  Skin:    Findings: No erythema or rash.  Neurological:     Mental Status: He is alert and oriented to person, place, and time.      Results for orders placed or performed in visit on 12/29/22  POCT rapid strep A  Result Value Ref Range   Rapid Strep A Screen Negative Negative    Assessment & Plan     Problem List Items Addressed This Visit       Respiratory   Acute non-recurrent frontal sinusitis -  Primary    Acute  Stable VS  Exam consistent with purulent congestion and TM effusion  Will treat with Augmentin 875-125mg  BID for 10 days  F/u PRN if worsening symptoms       Relevant Medications   amoxicillin-clavulanate (AUGMENTIN) 875-125 MG tablet     Other   Sore throat   Relevant Orders   POCT rapid strep A (Completed)     Return in about 10 days (around 01/08/2023), or if symptoms worsen or fail to improve.       The entirety of the information documented in the History of Present Illness, Review of Systems and Physical Exam were personally obtained by me. Portions of this information were initially documented by Hetty Ely, CMA . I, Ronnald Ramp, MD have reviewed the  documentation above for thoroughness and accuracy.      Ronnald Ramp, MD  [facility] [phone] (phone) [phone] (fax)  [facility]

## 2022-12-29 NOTE — Assessment & Plan Note (Signed)
Acute  Stable VS  Exam consistent with purulent congestion and TM effusion  Will treat with Augmentin 875-125mg  BID for 10 days  F/u PRN if worsening symptoms

## 2023-03-24 ENCOUNTER — Telehealth: Payer: BC Managed Care – PPO | Admitting: Family Medicine

## 2023-03-24 ENCOUNTER — Encounter: Payer: Self-pay | Admitting: Family Medicine

## 2023-03-24 DIAGNOSIS — U071 COVID-19: Secondary | ICD-10-CM | POA: Diagnosis not present

## 2023-03-24 MED ORDER — NIRMATRELVIR/RITONAVIR (PAXLOVID)TABLET: 3.0000 | ORAL_TABLET | Freq: Two times a day (BID) | ORAL | 0 refills | Status: AC

## 2023-03-24 NOTE — Progress Notes (Signed)
    MyChart Video Visit    Virtual Visit via Video Note   This format is felt to be most appropriate for this patient at this time. Physical exam was limited by quality of the video and audio technology used for the visit.   Patient location: home office Provider location: [facility] [address]  [place], [place] [address]   I discussed the limitations of evaluation and management by telemedicine and the availability of in person appointments. The patient expressed understanding and agreed to proceed.  Patient: Mark Curry   DOB: 1985/09/25   37 y.o. Male  MRN: [id number] Visit Date: 03/24/2023  Today's healthcare provider: Jacky Kindle, FNP  Introduced to nurse practitioner role and practice setting.   Chief Complaint  Patient presents with   Covid Positive    Symptoms started Tuesday, Symptoms include headache, sore throat, prior treatment include OTC meds   Subjective    HPI HPI     Covid Positive    Additional comments: Symptoms started Tuesday, Symptoms include headache, sore throat, prior treatment include OTC meds      Last edited by Rolly Salter, CMA on 03/24/2023  3:39 PM.       Medications: Outpatient Medications Prior to Visit  Medication Sig   [DISCONTINUED] amoxicillin-clavulanate (AUGMENTIN) 875-125 MG tablet Take 1 tablet by mouth 2 (two) times daily. (Patient not taking: Reported on 03/24/2023)   No facility-administered medications prior to visit.    Objective    There were no vitals taken for this visit.  Physical Exam Constitutional:      Appearance: He is obese.  Pulmonary:     Effort: Pulmonary effort is normal.  Neurological:     Mental Status: He is alert and oriented to person, place, and time.  Psychiatric:        Mood and Affect: Mood normal.        Behavior: Behavior normal.        Thought Content: Thought content normal.        Judgment: Judgment normal.      Assessment & Plan     Problem List  Items Addressed This Visit       Other   COVID - Primary    Acute, self limiting 3 days of URI symptoms +COVID home test today Recommend antiviral given obesity and hx of OSA on CPAP Reviewed supportive care and CDC recommendations Work note provided      Relevant Medications   nirmatrelvir/ritonavir (PAXLOVID) 20 x 150 MG & 10 x 100MG  TABS   Return if symptoms worsen or fail to improve.    I discussed the assessment and treatment plan with the patient. The patient was provided an opportunity to ask questions and all were answered. The patient agreed with the plan and demonstrated an understanding of the instructions.   The patient was advised to call back or seek an in-person evaluation if the symptoms worsen or if the condition fails to improve as anticipated.  I provided 10 minutes of face-to-face time during this encounter.  Leilani Merl, FNP, have reviewed all documentation for this visit. The documentation on 03/24/23 for the exam, diagnosis, procedures, and orders are all accurate and complete.   Jacky Kindle, FNP [facility] [phone] (phone) [phone] (fax)  [facility]

## 2023-03-24 NOTE — Assessment & Plan Note (Signed)
Acute, self limiting 3 days of URI symptoms +COVID home test today Recommend antiviral given obesity and hx of OSA on CPAP Reviewed supportive care and CDC recommendations Work note provided

## 2023-03-24 NOTE — Patient Instructions (Signed)
Your symptoms and exam findings are most consistent with a viral upper respiratory infection. These usually run their course in 5-7 days. Unfortunately, antibiotics don't work against viruses and just increase your risk of other issues such as diarrhea, yeast infections, and resistant infections.  If you start feeling worse with facial pain, high fever, cough, shortness of breath or start feeling significantly worse, please call us right away to be further evaluated.  Some things that can make you feel better are: - Increased rest - Increasing Fluids - Acetaminophen / ibuprofen as needed for fever/pain.  - Salt water gargling, chloraseptic spray and throat lozenges - OTC pseudoephedrine.  - Mucinex.  - Saline sinus flushes or a neti pot.  - Humidifying the air.  

## 2023-04-06 NOTE — Progress Notes (Signed)
Established patient visit   Patient: Mark Curry   DOB: Feb 11, 1986   37 y.o. Male  MRN: [id number] Visit Date: 04/13/2023  Today's healthcare provider: Ronnald Ramp, MD   Chief Complaint  Patient presents with   Follow-up    General questions for the doctor and some ongoing issues   Subjective     HPI     Follow-up    Additional comments: General questions for the doctor and some ongoing issues      Last edited by Clois Comber on 04/13/2023  2:11 PM.       Discussed the use of AI scribe software for clinical note transcription with the patient, who gave verbal consent to proceed.  History of Present Illness   The patient, with a recent history of COVID-19 infection, presents with ongoing gastrointestinal (GI) issues. He reports prolonged periods in the restroom, irregular bowel movements, and often loose stools. Despite taking fiber supplements, the symptoms persist. The patient has sought care from a gastroenterologist, undergoing a colonoscopy and blood work, but no significant findings were reported. He denies experiencing typical symptoms of Irritable Bowel Syndrome (IBS) such as bowel pain, but reports frequent gas and some discomfort associated with it. He has attempted dietary modifications, including reducing food intake, increasing water consumption, and incorporating more vegetables into meals, with minimal improvement.  In addition to the GI concerns, the patient expresses interest in being evaluated for Attention Deficit Hyperactivity Disorder (ADHD), given a strong family history of the condition. He reports frequent forgetfulness, easy distractibility, and difficulties with task initiation and completion. However, he denies experiencing hyperactivity or restlessness.  The patient also mentions potential exposure to H. pylori and E. coli within their household, but does not report any associated symptoms. He denies any changes in urine output or  color, and no blood in the urine. He is currently not on any prescribed medications but has been taking fiber supplements for his GI symptoms.         Medications: Outpatient Medications Prior to Visit  Medication Sig   [DISCONTINUED] BISACODYL 5 MG EC tablet Take 20 mg by mouth as directed.   Azelastine-Fluticasone 137-50 MCG/ACT SUSP SHAKE LIQUID AND USE 1 SPRAY IN EACH NOSTRIL TWICE DAILY Nasal for 30 Days   [DISCONTINUED] levocetirizine (XYZAL) 5 MG tablet 1 tablet in the evening Orally Once a day   [DISCONTINUED] montelukast (SINGULAIR) 10 MG tablet Take 10 mg by mouth daily.   No facility-administered medications prior to visit.    Review of Systems      Objective    BP 127/77 (BP Location: Right Arm, Patient Position: Sitting, Cuff Size: Large)   Pulse 67   SpO2 98%     Physical Exam  Physical Exam   ABDOMEN: Tenderness in the epigastric region. No discomfort upon palpation of the abdominal quadrants except for the epigastric area. Normal bowel sounds, no distention,no hepatomegaly       No results found for any visits on 04/13/23.  Assessment & Plan     Problem List Items Addressed This Visit     Attention and concentration deficit    Family history of ADHD. Reports of forgetfulness, difficulty starting projects, and finishing others' sentences. No significant impact on work productivity noted. -Referred to [facility] for formal evaluation.      Passage of loose stools - Primary    Chronic, long-standing symptoms of prolonged bowel movements, often loose stool, and gas. No significant pain,  blood, or changes in stool color. Previous colonoscopy and blood work unremarkable. Possible irritable bowel syndrome or malabsorption issue. -Refer to GI for further evaluation. -Order H. pylori breath test. -Order CMP and lipase to assess liver and gallbladder function. -Trial of pancreatic enzymes to aid digestion. Samples given       Relevant  Orders   Ambulatory referral to Gastroenterology   H. pylori breath test   Comprehensive metabolic panel   Lipase     COVID-19 Recovery Recent recovery from COVID-19 infection. No significant post-acute sequelae of SARS-CoV-2 infection (PASC, or "Long COVID") symptoms reported. -Monitor for any emerging symptoms related to PASC.         No follow-ups on file.         Ronnald Ramp, MD  [facility] [phone] (phone) [phone] (fax)  [facility]

## 2023-04-13 ENCOUNTER — Ambulatory Visit (INDEPENDENT_AMBULATORY_CARE_PROVIDER_SITE_OTHER): Payer: BC Managed Care – PPO | Admitting: Family Medicine

## 2023-04-13 ENCOUNTER — Encounter: Payer: Self-pay | Admitting: Family Medicine

## 2023-04-13 VITALS — BP 127/77 | HR 67

## 2023-04-13 DIAGNOSIS — R41840 Attention and concentration deficit: Secondary | ICD-10-CM | POA: Diagnosis not present

## 2023-04-13 DIAGNOSIS — R195 Other fecal abnormalities: Secondary | ICD-10-CM

## 2023-04-13 NOTE — Assessment & Plan Note (Signed)
Family history of ADHD. Reports of forgetfulness, difficulty starting projects, and finishing others' sentences. No significant impact on work productivity noted. -Referred to [facility] for formal evaluation.

## 2023-04-13 NOTE — Assessment & Plan Note (Addendum)
Chronic, long-standing symptoms of prolonged bowel movements, often loose stool, and gas. No significant pain, blood, or changes in stool color. Previous colonoscopy and blood work unremarkable. Possible irritable bowel syndrome or malabsorption issue. -Refer to GI for further evaluation. -Order H. pylori breath test. -Order CMP and lipase to assess liver and gallbladder function. -Trial of pancreatic enzymes to aid digestion. Samples given

## 2023-04-14 LAB — COMPREHENSIVE METABOLIC PANEL
ALT: 41 IU/L (ref 0–44)
AST: 31 IU/L (ref 0–40)
Albumin: 4.5 g/dL (ref 4.1–5.1)
Alkaline Phosphatase: 66 IU/L (ref 44–121)
BUN/Creatinine Ratio: 18 (ref 9–20)
BUN: 16 mg/dL (ref 6–20)
Bilirubin Total: 0.5 mg/dL (ref 0.0–1.2)
CO2: 25 mmol/L (ref 20–29)
Calcium: 9.4 mg/dL (ref 8.7–10.2)
Chloride: 103 mmol/L (ref 96–106)
Creatinine, Ser: 0.89 mg/dL (ref 0.76–1.27)
Globulin, Total: 3 g/dL (ref 1.5–4.5)
Glucose: 96 mg/dL (ref 70–99)
Potassium: 4.1 mmol/L (ref 3.5–5.2)
Sodium: 139 mmol/L (ref 134–144)
Total Protein: 7.5 g/dL (ref 6.0–8.5)
eGFR: 114 mL/min/{1.73_m2} (ref >59)

## 2023-04-14 LAB — LIPASE: Lipase: 19 U/L (ref 13–78)

## 2023-04-15 LAB — H. PYLORI BREATH TEST: H pylori Breath Test: NEGATIVE

## 2023-05-30 ENCOUNTER — Encounter: Payer: Self-pay | Admitting: Physician Assistant

## 2023-05-30 ENCOUNTER — Ambulatory Visit: Payer: BC Managed Care – PPO | Admitting: Physician Assistant

## 2023-05-30 VITALS — BP 119/75 | HR 67 | Temp 98.0°F | Ht 71.0 in | Wt 313.6 lb

## 2023-05-30 DIAGNOSIS — K580 Irritable bowel syndrome with diarrhea: Secondary | ICD-10-CM | POA: Diagnosis not present

## 2023-05-30 DIAGNOSIS — K648 Other hemorrhoids: Secondary | ICD-10-CM | POA: Diagnosis not present

## 2023-05-30 DIAGNOSIS — R195 Other fecal abnormalities: Secondary | ICD-10-CM

## 2023-05-30 DIAGNOSIS — R198 Other specified symptoms and signs involving the digestive system and abdomen: Secondary | ICD-10-CM

## 2023-05-30 DIAGNOSIS — K644 Residual hemorrhoidal skin tags: Secondary | ICD-10-CM | POA: Diagnosis not present

## 2023-05-30 MED ORDER — DICYCLOMINE HCL 20 MG PO TABS: 20.0000 mg | ORAL_TABLET | Freq: Two times a day (BID) | ORAL | 3 refills | Status: DC

## 2023-05-30 NOTE — Patient Instructions (Signed)
It was nice to meet you today.  Please continue fiber tablets daily.  I sent prescription for dicyclomine 20 Mg 1 tablet once or twice daily as needed for cramping and diarrhea.

## 2023-05-30 NOTE — Progress Notes (Signed)
Celso Amy, PA-C [address]  [address]  [place], [place] [address]  Main: [phone]  Fax: [phone]   Gastroenterology Consultation  Referring Provider:     Brett Albino* Primary Care Physician:  Ronnald Ramp, MD Primary Gastroenterologist:  Celso Amy, PA-C  Reason for Consultation:     Loose stools        HPI:   Mark Curry is a 37 y.o. y/o male referred for consultation & management  by Ronnald Ramp, MD.    Patient is new to our practice.  He saw Dr. Bosie Clos at [facility] 02/2023 to evaluate diarrhea.  Celiac labs were negative.  TSH normal.  Diarrhea was thought that secondary to food intolerance and/or stress.  He was advised to take Imodium as needed.  He has had chronic intermittent diarrhea for many years.  Colonoscopy 10/2022 by Dr. Bosie Clos showed medium internal hemorrhoids and no polyps.  Biopsies negative for microscopic colitis.  10-year repeat.  H. pylori breath test negative 04/13/2023.  CMP and lipase normal.  RUQ ultrasound 09/2017 showed hepatic steatosis and normal gallbladder.  Current symptoms: Patient has 2 bowel movements daily which are loose.  He reports having loose stools for over 10 years.  Occasional episode of constipation.  Denies rectal bleeding, weight loss, or significant abdominal pain.  Has increased gas, abdominal bloating and discomfort.  Still has gallbladder.  Tried taking fiber caplets which helped some.  Took MiraLAX in the past with no benefit.  Also tried ZenPep in the past with little benefit, yet he was inconsistent with treatment.  He also has history of hemorrhoids in the past and wanted advice about treatment.  Hemorrhoids have currently improved.  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 Authorizing Provider  Azelastine-Fluticasone 137-50 MCG/ACT SUSP SHAKE LIQUID AND USE 1 SPRAY IN EACH NOSTRIL TWICE DAILY Nasal for 30 Days   Yes [provider]    Family History  Problem Relation Age of Onset   Depression Mother    Polycystic ovary syndrome Mother    Diabetes Father    Insulin resistance Sister    Allergic rhinitis Brother    Multiple sclerosis Maternal Grandmother    Angioedema Neg Hx    Asthma Neg Hx    Eczema Neg Hx    Immunodeficiency Neg Hx    Urticaria Neg Hx    Sleep apnea Neg Hx      Social History   Tobacco Use   Smoking status: Never   Smokeless tobacco: Never  Vaping Use   Vaping status: Never Used  Substance Use Topics   Alcohol use: Not Currently    Alcohol/week: 1.0 standard drink of alcohol    Types: 1 Cans of beer per week   Drug use: Never    Allergies as of 05/30/2023   (No Known Allergies)    Review of Systems:    All systems reviewed and negative except where noted in HPI.   Physical Exam:  BP 119/75   Pulse 67   Temp 98 F (36.7 C)   Ht 5\' 11"  (1.803 m)   Wt (!) 313 lb 9.6 oz (142.2 kg)  BMI 43.74 kg/m  No LMP for male patient.  General:   Alert,  Well-developed, well-nourished, pleasant and cooperative in NAD Lungs:  Respirations even and unlabored.  Clear throughout to auscultation.   No wheezes, crackles, or rhonchi. No acute distress. Heart:  Regular rate and rhythm; no murmurs, clicks, rubs, or gallops. Abdomen:  Normal bowel sounds.  No bruits.  Soft, and non-distended without masses, hepatosplenomegaly or hernias noted.  Mild LLQ and RLQ Tenderness; Rest of abdomen not tender.  No guarding or rebound tenderness.    Neurologic:  Alert and oriented x3;  grossly normal neurologically. Psych:  Alert and cooperative. Normal mood and affect.  Imaging Studies: No results found.  Assessment and Plan:   Mark Curry is a  37 y.o. y/o male has been referred for chronic loose stools.  Suspicious for IBS.  Colonoscopy 10/2022 showed no evidence of inflammatory bowel disease.  Biopsies negative for microscopic colitis.  He has had negative celiac and H. pylori test.  He has no alarm symptoms.  1.  Loose stools  Lab: Food allergy panel and alpha gal  Stool test: fecal pancreatic elastase  Continue fiber tablet every day consistently.  2.  IBS with diarrhea and rare constipation  Rx dicyclomine 20 Mg 1 tablet once or twice daily as needed  Low FODMAP diet  3.  Hemorrhoids  Discussed treatment for hemorrhoids at length.  For External Hemorrhoids: Warm water sitz bath with epsom salt for flare up of external hemorrhoids. Use OTC Preparation H, Tucks Pads, and Witch Hazel wipes as needed. He can call back for hydrocortisone 2.5% cream prescription if needed.  He declined prescription today.  For Internal Hemorrhoids: Stressed importance of treating underlying constipation. Avoid Sitting on the toilet for prolonged amount of time. He can call back for hydrocortisone suppository in the future if needed.   Follow up in 3 months.  Celso Amy, PA-C

## 2023-06-01 ENCOUNTER — Ambulatory Visit: Payer: BC Managed Care – PPO

## 2023-06-01 DIAGNOSIS — Z23 Encounter for immunization: Secondary | ICD-10-CM | POA: Diagnosis not present

## 2023-06-01 DIAGNOSIS — Z719 Counseling, unspecified: Secondary | ICD-10-CM

## 2023-06-01 LAB — ALPHA-GAL PANEL
Allergen Lamb IgE: <0.1 kU/L
Beef IgE: <0.1 kU/L
IgE (Immunoglobulin E), Serum: 104 [IU]/mL (ref 6–495)
O215-IgE Alpha-Gal: <0.1 kU/L
Pork IgE: <0.1 kU/L

## 2023-06-01 LAB — FOOD ALLERGY PROFILE
Allergen Corn, IgE: <0.1 kU/L
Clam IgE: <0.1 kU/L
Codfish IgE: <0.1 kU/L
Egg White IgE: <0.1 kU/L
Milk IgE: <0.1 kU/L
Peanut IgE: <0.1 kU/L
Scallop IgE: <0.1 kU/L
Sesame Seed IgE: <0.1 kU/L
Shrimp IgE: 0.24 kU/L — AB
Soybean IgE: <0.1 kU/L
Walnut IgE: <0.1 kU/L
Wheat IgE: <0.1 kU/L

## 2023-06-01 NOTE — Progress Notes (Signed)
In nurse clinic for immunizations Covid and Flu. VIS given.Tolerated vaccines well. [facility] updated and copy given to patient. No adverse reaction noted after .

## 2023-06-03 LAB — PANCREATIC ELASTASE, FECAL: Pancreatic Elastase, Fecal: >800 ug Elast./g (ref >200)

## 2023-06-05 ENCOUNTER — Encounter: Payer: Self-pay | Admitting: Anesthesiology

## 2023-06-05 ENCOUNTER — Telehealth: Payer: Self-pay | Admitting: Adult Health

## 2023-06-05 NOTE — Telephone Encounter (Signed)
Appt reminder unable to reach

## 2023-06-05 NOTE — Progress Notes (Deleted)
[facility] [address] [place]. [place] [address] [phone]       OFFICE FOLLOW UP NOTE  [name] Date of Birth:  September 16, 1985 Medical Record Number:  [id number]   Reason for visit: CPAP follow-up    Virtual Visit via Video Note  I connected with Mark Curry on 06/05/23 at  8:45 AM EDT by a video enabled telemedicine application and verified that I am speaking with the correct person using two identifiers.  Location: Patient: At home Provider: In office   I discussed the limitations of evaluation and management by telemedicine and the availability of in person appointments. The patient expressed understanding and agreed to proceed.     SUBJECTIVE:  Follow up visit:  Prior visit:  06/06/2022   Brief HPI:   Mark Curry is a 37 y.o. male who was evaluated by Dr. Frances Furbish on 08/26/2021 for concern of underlying sleep apnea with complaints of snoring and excessive daytime sleepiness. ESS 15/24. FSS 33/63. Completed nocturnal polysomnography on 09/22/2021 which showed mild OSA at 10.1/h although severe severe REM AHI 62.3/h and severe supine REM sleep related desats with O2 nadir 78%.  AutoPAP initiated ***. Pressure setting changes 11/2021 due to elevated AHI with noted improvement on pressure setting of  7-17.   At prior visit, compliance report showed satisfactory compliance and optimal residual AHI.    Interval history:    Update 06/01/2022 JM: Patient returns for 74-month CPAP compliance visit.  AutoPap settings required adjustment after prior visit due to elevated AHI and eventually saw improvement on AutoPap pressure of 7-17, currently residual AHI 4.7.  tolerating CPAP well, continues to feel refreshed upon awakening, good energy levels during the day.  Is up-to-date with supplies.  No concerns today.            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)    No pertinent past medical history    Sleep apnea     PSH:  Past Surgical History:  Procedure Laterality Date   TONSILLECTOMY      Social History:  Social History   Socioeconomic History   Marital status: Married    Spouse name: Not on file   Number of children: Not on file   Years of education: Not on file   Highest education level: Doctorate  Occupational History   Not on file  Tobacco Use   Smoking status: Never   Smokeless tobacco: Never  Vaping Use   Vaping status: Never Used  Substance and Sexual Activity   Alcohol use: Not Currently    Alcohol/week: 1.0 standard drink of alcohol    Types: 1 Cans of beer per week   Drug use: Never   Sexual activity: Yes  Other Topics Concern   Not on file  Social History Narrative   Not on file   Social Determinants of Health   Financial Resource Strain: Low Risk  (11/14/2022)   Overall Financial Resource Strain (CARDIA)    Difficulty of Paying Living Expenses: Not hard at all  Food Insecurity: No Food Insecurity (11/14/2022)   Hunger Vital Sign    Worried About Running Out of Food in the Last Year: Never true    Ran Out of Food in the Last Year: Never true  Transportation Needs: No  Transportation Needs (11/14/2022)   PRAPARE - Administrator, Civil Service (Medical): No    Lack of Transportation (Non-Medical): No  Physical Activity: Insufficiently Active (11/14/2022)   Exercise Vital Sign    Days of Exercise per Week: 2 days    Minutes of Exercise per Session: 60 min  Stress: Stress Concern Present (11/14/2022)   [employer] of Occupational Health - Occupational Stress Questionnaire    Feeling of Stress : To some extent  Social Connections: Moderately Isolated (11/14/2022)   Social Connection and Isolation Panel [NHANES]    Frequency of Communication with Friends and Family: Once a week    Frequency of Social  Gatherings with Friends and Family: Once a week    Attends Religious Services: More than 4 times per year    Active Member of [employer] or Organizations: No    Attends Engineer, structural: Not on file    Marital Status: Married  Catering manager Violence: Not on file    Family History:  Family History  Problem Relation Age of Onset   Depression Mother    Polycystic ovary syndrome Mother    Diabetes Father    Insulin resistance Sister    Allergic rhinitis Brother    Multiple sclerosis Maternal Grandmother    Angioedema Neg Hx    Asthma Neg Hx    Eczema Neg Hx    Immunodeficiency Neg Hx    Urticaria Neg Hx    Sleep apnea Neg Hx     Medications:   Current Outpatient Medications on File Prior to Visit  Medication Sig Dispense Refill   Azelastine-Fluticasone 137-50 MCG/ACT SUSP SHAKE LIQUID AND USE 1 SPRAY IN EACH NOSTRIL TWICE DAILY Nasal for 30 Days     dicyclomine (BENTYL) 20 MG tablet Take 1 tablet (20 mg total) by mouth 2 (two) times daily before a meal. 60 tablet 3   No current facility-administered medications on file prior to visit.    Allergies:  No Known Allergies    OBJECTIVE:  Physical Exam N/A d/t visit type        ASSESSMENT: Mark Curry is a 37 y.o. year old male with new diagnosis of mild OSA although severe REM OSA and REM sleep related desaturations.  CPAP initiated 10/14/2021     PLAN:  OSA on CPAP : Compliance report shows excellent compliance and optimal residual AHI.  Continue current pressure settings at this time.  Discussed importance of continued nightly usage with ensuring greater than 4 hours per night for optimal benefit and for insurance requirements.  Continue to follow with DME company for any needed supplies or CPAP related concerns.    Follow up in 1 year or call earlier if needed   CC:  PCP: Simmons-Robinson, Makiera, MD    I spent 20 minutes of face-to-face and non-face-to-face time with patient.  This included  previsit chart review, lab review, study review, order entry, electronic health record documentation, patient education regarding sleep apnea with review and discussion of compliance report and answered all other questions to patient's satisfaction   Ihor Austin, [facility]  [facility] [address] [place], [place] [address]  Phone [phone] Fax [phone] Note: This document was prepared with digital dictation and possible smart phrase technology. Any transcriptional errors that result from this process are unintentional.

## 2023-06-06 ENCOUNTER — Telehealth: Payer: BC Managed Care – PPO | Admitting: Adult Health

## 2023-06-11 NOTE — Progress Notes (Signed)
Please call and notify pt.: 1. Fecal pancreatic elastase test is normal.  No evidence of pancreatic insufficiency. 2.  Alpha gal blood test is negative.  No evidence of allergy to beef pork or lamb. 3. Food allergy panal shows slight increased risk of allergy to shrimp.  All other food allergies are negative. 4.  I hope pt. Is feeling better. Celso Amy, PA-C

## 2023-06-12 ENCOUNTER — Telehealth: Payer: Self-pay

## 2023-06-12 NOTE — Telephone Encounter (Signed)
Patient notified.  1. Fecal pancreatic elastase test is normal.  No evidence of pancreatic insufficiency.  2.  Alpha gal blood test is negative.  No evidence of allergy to beef pork or lamb.  3. Food allergy panal shows slight increased risk of allergy to shrimp.  All other food allergies are negative.  4.  I hope pt. Is feeling better.  Celso Amy, PA-C

## 2023-08-14 ENCOUNTER — Ambulatory Visit: Payer: BC Managed Care – PPO | Admitting: Family Medicine

## 2023-08-14 ENCOUNTER — Encounter: Payer: Self-pay | Admitting: Family Medicine

## 2023-08-14 ENCOUNTER — Ambulatory Visit: Payer: Self-pay | Admitting: *Deleted

## 2023-08-14 VITALS — BP 135/75 | HR 91 | Resp 18 | Ht 71.0 in | Wt 314.0 lb

## 2023-08-14 DIAGNOSIS — D692 Other nonthrombocytopenic purpura: Secondary | ICD-10-CM

## 2023-08-14 DIAGNOSIS — D696 Thrombocytopenia, unspecified: Secondary | ICD-10-CM | POA: Diagnosis not present

## 2023-08-14 NOTE — Progress Notes (Signed)
Established patient visit   Patient: Mark Curry   DOB: Oct 28, 1985   37 y.o. Male  MRN: [id number] Visit Date: 08/14/2023  Today's healthcare provider: Sherlyn Hay, DO   Chief Complaint  Patient presents with   Bleeding/Bruising   Subjective    HPI Mark Curry, a patient with a history of thrombocytopenia, presents with skin changes on the shoulders following use of a gym machine. This is the second time he has used this particular machine, with no issues reported after the first use. He noticed bruising along the shoulders, more pronounced on the left side than the right. He denies any recent acute illnesses but mentions exposure to common childhood illnesses from his two children and a sore throat approximately a week ago, which resolved spontaneously after approximately four days. He has not been treated for any infection recently.   The skin changes are described as small clusters of petechiae, with no associated tenderness or texture changes. He likened the appearance to previously-experienced petechiae.  Mark Curry has a history of thrombocytopenia, which was previously associated with a stomach virus. He also mentioned an episode of vomiting last year that resulted in bloodshot eyes, which was attributed to ruptured eye vessels due to the force of vomiting.  However, he suspects that the skin changes are likely due to mechanical force from the gym machine rather than a systemic issue.    Medications: Outpatient Medications Prior to Visit  Medication Sig   Azelastine-Fluticasone 137-50 MCG/ACT SUSP SHAKE LIQUID AND USE 1 SPRAY IN EACH NOSTRIL TWICE DAILY Nasal for 30 Days   dicyclomine (BENTYL) 20 MG tablet Take 1 tablet (20 mg total) by mouth 2 (two) times daily before a meal.   No facility-administered medications prior to visit.       Objective    BP 135/75   Pulse 91   Resp 18   Ht 5\' 11"  (1.803 m)   Wt (!) 314 lb (142.4 kg)   SpO2 99%   BMI 43.79 kg/m      Physical Exam Vitals and nursing note reviewed.  Constitutional:      General: He is not in acute distress.    Appearance: Normal appearance.  HENT:     Head: Normocephalic and atraumatic.  Eyes:     General: No scleral icterus.    Conjunctiva/sclera: Conjunctivae normal.  Cardiovascular:     Rate and Rhythm: Normal rate.  Pulmonary:     Effort: Pulmonary effort is normal.  Skin:    Findings: Petechiae and rash present. Rash is purpuric.          Comments: Two smalls confluent areas of petechiae/purpura to tops of shoulder bilaterally   Neurological:     Mental Status: He is alert and oriented to person, place, and time. Mental status is at baseline.  Psychiatric:        Mood and Affect: Mood normal.        Behavior: Behavior normal.      No results found for any visits on 08/14/23.  Assessment & Plan    Purpura of skin caused by mechanical force [facility]) Assessment & Plan: Given recent sore throat and history of ITP in relation to infection, will go ahead and check CBC to assess platelet levels. However, given the dispersal pattern and occurrence in relation to patient's weight-lifting (occurring shortly after weight-lifting), I suspect the petechiae/purpura are secondary to mechanical abrasion of underlying tissues.  Orders: -     CBC  Thrombocytopenia (HCC) -     CBC   Return if symptoms worsen or fail to improve, and as planned with PCP.      I discussed the assessment and treatment plan with the patient  The patient was provided an opportunity to ask questions and all were answered. The patient agreed with the plan and demonstrated an understanding of the instructions.   The patient was advised to call back or seek an in-person evaluation if the symptoms worsen or if the condition fails to improve as anticipated.    Sherlyn Hay, DO  [facility] [phone] (phone) [phone] (fax)  [facility]

## 2023-08-14 NOTE — Telephone Encounter (Signed)
Appt scheduled

## 2023-08-14 NOTE — Telephone Encounter (Signed)
Recommend appt. Looks like Pardue has her 140 open. Can someone get him scheduled please?

## 2023-08-14 NOTE — Telephone Encounter (Signed)
  Chief Complaint: Bruising Symptoms: Bruising both shoulders, sent pic via MyChart Frequency: Noted this Am Pertinent Negatives: Patient denies any other bleeding elsewhere Disposition: [] ED /[] Urgent Care (no appt availability in office) / [] Appointment(In office/virtual)/ []  [facility] [place] [facility]/ [] Home Care/ [] Refused Recommended Disposition /[]  Mobile Bus/ [x]  Follow-up with PCP Additional Notes:  States one shoulder "Could be petechiae."  No availability, called practice for consult, Victorino Dike. Will route to practice for review. Pt asking to come in for labs only. Please advise.  Reason for Disposition  Taking Coumadin (warfarin) or other strong blood thinner, or known bleeding disorder (e.g., thrombocytopenia)  (Exception: Very small, painless bruise at heparin injection site.)    Not on Coumadin, H/O thrombocytopenia  Answer Assessment - Initial Assessment Questions 1. APPEARANCE of BRUISE: "Describe the bruise."      Small, both shoulders 2. SIZE: "How large is the bruise?"      Sent Pic via MyChary 3. NUMBER: "How many bruises are there?"       1 on each shoulder 4. LOCATION: "Where is the bruise located?"       5. ONSET: "How long ago did the bruise occur?"      This AM 6. CAUSE: "Tell me how it happened."     Not sut "I worked out, played with my kinds." 7. MEDICAL HISTORY: "Do you have any medical problems that can cause easy bruising or bleeding?" (e.g., leukemia, liver disease, recent chemotherapy)     Yes. Last episode years ago 2021 8. MEDICINES: "Do you take any medications which thin the blood such as: aspirin, heparin, ibuprofen (NSAIDS), Plavix, or Coumadin?"     Yes 9. OTHER SYMPTOMS: "Do you have any other symptoms?"  (e.g., weakness, dizziness, pain, fever, nosebleed, blood in urine/stool) no  Protocols used: Bruises-A-AH

## 2023-08-14 NOTE — Assessment & Plan Note (Signed)
Given recent sore throat and history of ITP in relation to infection, will go ahead and check CBC to assess platelet levels. However, given the dispersal pattern and occurrence in relation to patient's weight-lifting (occurring shortly after weight-lifting), I suspect the petechiae/purpura are secondary to mechanical abrasion of underlying tissues.

## 2023-08-15 LAB — CBC
Hematocrit: 45.4 % (ref 37.5–51.0)
Hemoglobin: 15.2 g/dL (ref 13.0–17.7)
MCH: 28.8 pg (ref 26.6–33.0)
MCHC: 33.5 g/dL (ref 31.5–35.7)
MCV: 86 fL (ref 79–97)
Platelets: 190 10*3/uL (ref 150–450)
RBC: 5.27 x10E6/uL (ref 4.14–5.80)
RDW: 12.6 % (ref 11.6–15.4)
WBC: 6 10*3/uL (ref 3.4–10.8)

## 2023-12-06 ENCOUNTER — Ambulatory Visit: Admitting: Family Medicine

## 2023-12-06 VITALS — BP 110/70 | HR 110 | Temp 100.9°F | Ht 72.0 in | Wt 304.7 lb

## 2023-12-06 DIAGNOSIS — R1110 Vomiting, unspecified: Secondary | ICD-10-CM | POA: Diagnosis not present

## 2023-12-06 DIAGNOSIS — R509 Fever, unspecified: Secondary | ICD-10-CM | POA: Diagnosis not present

## 2023-12-06 DIAGNOSIS — R197 Diarrhea, unspecified: Secondary | ICD-10-CM

## 2023-12-06 MED ORDER — ONDANSETRON 4 MG PO TBDP: 4.0000 mg | ORAL_TABLET | Freq: Three times a day (TID) | ORAL | 0 refills | Status: DC | PRN

## 2023-12-06 NOTE — Progress Notes (Signed)
 Patient ID: Mark Curry, male    DOB: [birth year], 38 y.o.   MRN: [id number]  PCP: Mimi Alt, MD  Chief Complaint  Patient presents with   Emesis    Emesis associated with diarrhea. Pt reports diarrhea started yesterday about 6 pm and vomiting around 7:30. Reports last vomited about 11:30 pm and one movement today around 1:20. No fever that patient was aware of. Patient reports no form of treatment. Has only been drinking Gatorade. Reports having breakfast at a new restaurant and 38 year old came home today vomiting from school reports she was a little nauseous this morning and has thrown up twice. 38 year old threw up this Sunday.     Subjective:   Mark Curry is a 38 y.o. male, presents to clinic with CC of the following:  HPI  Presents with onset of N/V/D last night, multiple vomiting episodes, emesis non-bloodty nonbilious, no abd pain, diarrhea 1 x today.  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) 02/02/2020   Thrombocytopenia (HCC) 02/01/2020   Fatty liver 03/15/2018   Elevated liver enzymes 03/15/2018   Anosmia 11/06/2017      Current Outpatient Medications:    Azelastine -Fluticasone  137-50 MCG/ACT SUSP, SHAKE LIQUID AND USE 1 SPRAY IN EACH NOSTRIL TWICE DAILY Nasal for 30 Days, Disp: , Rfl:    dicyclomine  (BENTYL ) 20 MG tablet, Take 1 tablet (20 mg total) by mouth 2 (two) times daily before a meal., Disp: 60  tablet, Rfl: 3   No Known Allergies   Social History   Tobacco Use   Smoking status: Never   Smokeless tobacco: Never  Vaping Use   Vaping status: Never Used  Substance Use Topics   Alcohol use: Not Currently    Alcohol/week: 1.0 standard drink of alcohol    Types: 1 Cans of beer per week   Drug use: Never      Chart Review Today: I personally reviewed active problem list, medication list, allergies, family history, social history, health maintenance, notes from last encounter, lab results, imaging with the patient/caregiver today.   Review of Systems  Constitutional: Negative.   HENT: Negative.    Eyes: Negative.   Respiratory: Negative.    Cardiovascular: Negative.   Gastrointestinal: Negative.   Endocrine: Negative.   Genitourinary: Negative.   Musculoskeletal: Negative.   Skin: Negative.   Allergic/Immunologic: Negative.   Neurological: Negative.   Hematological: Negative.   Psychiatric/Behavioral: Negative.    All other systems reviewed and are negative.      Objective:   Vitals:   12/06/23 1423  BP: 110/70  Pulse: (!) 110  Temp: (!) 100.9 F (38.3 C)  TempSrc: Oral  SpO2: 95%  Weight: (!) 304 lb 11.2 oz (138.2 kg)  Height: 6' (1.829 m)    Body mass index is 41.32 kg/m.  Physical Exam Vitals and nursing note reviewed.  Constitutional:  General: He is not in acute distress.    Appearance: Normal appearance. He is well-developed. He is obese. He is not ill-appearing, toxic-appearing or diaphoretic.  HENT:     Head: Normocephalic and atraumatic.     Nose: Nose normal.     Mouth/Throat:     Mouth: Mucous membranes are moist.     Pharynx: Oropharynx is clear. No oropharyngeal exudate or posterior oropharyngeal erythema.  Eyes:     General: No scleral icterus.       Right eye: No discharge.        Left eye: No discharge.     Conjunctiva/sclera: Conjunctivae normal.  Neck:     Trachea: No tracheal deviation.  Cardiovascular:     Rate  and Rhythm: Regular rhythm. Tachycardia present.     Pulses: Normal pulses.     Heart sounds: Normal heart sounds. No murmur heard.    No friction rub. No gallop.  Pulmonary:     Effort: Pulmonary effort is normal. No respiratory distress.     Breath sounds: Normal breath sounds. No stridor. No wheezing, rhonchi or rales.  Abdominal:     General: Bowel sounds are normal. There is no distension.     Palpations: Abdomen is soft.     Tenderness: There is no abdominal tenderness. There is no right CVA tenderness, left CVA tenderness, guarding or rebound.  Skin:    General: Skin is warm and dry.     Coloration: Skin is not jaundiced or pale.     Findings: No rash.  Neurological:     Mental Status: He is alert.     Motor: No abnormal muscle tone.     Coordination: Coordination normal.  Psychiatric:        Behavior: Behavior normal.      Results for orders placed or performed in visit on 08/14/23  CBC   Collection Time: 08/14/23  2:21 PM  Result Value Ref Range   WBC 6.0 3.4 - 10.8 x10E3/uL   RBC 5.27 4.14 - 5.80 x10E6/uL   Hemoglobin 15.2 13.0 - 17.7 g/dL   Hematocrit 40.9 81.1 - 51.0 %   MCV 86 79 - 97 fL   MCH 28.8 26.6 - 33.0 pg   MCHC 33.5 31.5 - 35.7 g/dL   RDW 91.4 78.2 - 95.6 %   Platelets 190 150 - 450 x10E3/uL       Assessment & Plan:     ICD-10-CM   1. Vomiting and diarrhea  R11.10    R19.7    multiple family members sick, fever, tolerating fluids today, suspect viral gastroenteritis, tx with antiemetics and slow advance of diet    2. Febrile illness  R50.9    febrile and mildly tachy, supports viral etiology, pt appears well hydrated and hemodynamically stable, tx with antipyretics     Suspect acute viral gastroenteritis - tx with antiemetics, slow advance of diet, push electrolytes Encourage him to treat his fever with Tylenol  and he can use NSAIDs if his stomach tolerates He does have Bentyl  at home which she could use if he has any abdominal cramping If  he has not flamed or irritated epigastric area, indigestion or burning I did also encourage him to try Pepcid over-the-counter No vomiting today so he may already be improving Explained that he should not return to work until fever free, vomiting free and diarrhea free for more than 24 hours without medications Work note provided through [employer]   recommend follow-up if not improving or  if any worsening  Was planning on doing a COVID test in office but we did not have supplies, patient can do a home test encouraged him to send me a MyChart message if positive so I could prescribe Paxlovid  - he may send mychart msg to f/up on that   Adeline Hone, PA-C 12/06/23 2:28 PM

## 2023-12-07 ENCOUNTER — Ambulatory Visit: Admitting: Family Medicine

## 2024-02-06 ENCOUNTER — Telehealth: Payer: Self-pay | Admitting: Adult Health

## 2024-02-06 DIAGNOSIS — G4733 Obstructive sleep apnea (adult) (pediatric): Secondary | ICD-10-CM

## 2024-02-06 NOTE — Telephone Encounter (Signed)
 Pt and wife called in regards to request new rx be fax to CPAP company for PT can receive updated supplies .    DME: [facility]  Phone: [phone] Fax: [phone]

## 2024-02-07 NOTE — Telephone Encounter (Signed)
 I have faxed the order to [facility]. Received a receipt of confirmation.  I tried to call the patient and his wife but neither answered after multiple rings and I was unable to LVM. Received message that said call could not be completed.

## 2024-02-07 NOTE — Telephone Encounter (Signed)
 Order placed today but please advise patient insurance may require face-to-face visit as he has not been seen since 05/2022. Please ensure appt is kept for October. If insurance is requiring a F2F visit, please see if patient can be seen sooner with either myself or MD. Thank you

## 2024-02-12 NOTE — Telephone Encounter (Signed)
 Order faxed to [facility]  Fax: [phone]. Confirmation received.

## 2024-02-13 ENCOUNTER — Encounter: Payer: Self-pay | Admitting: Family Medicine

## 2024-02-28 ENCOUNTER — Encounter: Payer: Self-pay | Admitting: Family Medicine

## 2024-02-28 ENCOUNTER — Ambulatory Visit: Admitting: Family Medicine

## 2024-02-28 VITALS — BP 117/75 | HR 73 | Ht 72.0 in | Wt 309.0 lb

## 2024-02-28 DIAGNOSIS — Z6841 Body Mass Index (BMI) 40.0 and over, adult: Secondary | ICD-10-CM | POA: Diagnosis not present

## 2024-02-28 DIAGNOSIS — Z131 Encounter for screening for diabetes mellitus: Secondary | ICD-10-CM

## 2024-02-28 DIAGNOSIS — Z9229 Personal history of other drug therapy: Secondary | ICD-10-CM | POA: Diagnosis not present

## 2024-02-28 DIAGNOSIS — Z130 Encounter for screening for diseases of the blood and blood-forming organs and certain disorders involving the immune mechanism: Secondary | ICD-10-CM

## 2024-02-28 DIAGNOSIS — G4733 Obstructive sleep apnea (adult) (pediatric): Secondary | ICD-10-CM | POA: Diagnosis not present

## 2024-02-28 DIAGNOSIS — D696 Thrombocytopenia, unspecified: Secondary | ICD-10-CM | POA: Diagnosis not present

## 2024-02-28 DIAGNOSIS — Z0000 Encounter for general adult medical examination without abnormal findings: Secondary | ICD-10-CM

## 2024-02-28 DIAGNOSIS — Z23 Encounter for immunization: Secondary | ICD-10-CM

## 2024-02-28 DIAGNOSIS — E66812 Obesity, class 2: Secondary | ICD-10-CM | POA: Insufficient documentation

## 2024-02-28 DIAGNOSIS — Z13220 Encounter for screening for lipoid disorders: Secondary | ICD-10-CM

## 2024-02-28 DIAGNOSIS — Z0001 Encounter for general adult medical examination with abnormal findings: Secondary | ICD-10-CM | POA: Diagnosis not present

## 2024-02-28 DIAGNOSIS — E66813 Obesity, class 3: Secondary | ICD-10-CM | POA: Diagnosis not present

## 2024-02-28 DIAGNOSIS — E669 Obesity, unspecified: Secondary | ICD-10-CM | POA: Insufficient documentation

## 2024-02-28 MED ORDER — TIRZEPATIDE-WEIGHT MANAGEMENT 2.5 MG/0.5ML ~~LOC~~ SOLN: 2.5000 mg | SUBCUTANEOUS | 0 refills | Status: DC

## 2024-02-28 MED ORDER — ZEPBOUND 2.5 MG/0.5ML ~~LOC~~ SOAJ: 2.5000 mg | SUBCUTANEOUS | 0 refills | Status: DC

## 2024-02-28 MED ORDER — ZEPBOUND 5 MG/0.5ML ~~LOC~~ SOAJ: 5.0000 mg | SUBCUTANEOUS | 3 refills | Status: DC

## 2024-02-28 NOTE — Patient Instructions (Signed)
 It was a pleasure to see you today!  Thank you for choosing [facility] for your primary care.   Today you were seen for your annual physical  Please review the attached information regarding helpful preventive health topics.   To keep you healthy, please keep in mind the following health maintenance items that you are due for:   Health Maintenance Due  Topic Date Due   Hepatitis B Vaccines (1 of 3 - 19+ 3-dose series) Never done   HPV VACCINES (1 - Risk 3-dose SCDM series) Never done   COVID-19 Vaccine (2 - Pfizer risk series) 06/22/2023     Best Wishes,   Dr. Lang

## 2024-02-28 NOTE — Assessment & Plan Note (Signed)
 He has hematological issues with previous false scares with hx of thrombocytopenia. Requests referral to a hematologist for ongoing monitoring. Agreed to proceed with referral regardless of current blood work results. - Refer to a hematologist for further evaluation and monitoring of hematological status.

## 2024-02-28 NOTE — Assessment & Plan Note (Signed)
 Chronic  Stable with CPAP use  Patient advised to continue CPAP nightly  The patient has had significant benefit from the use of CPAP machine with considerable improvements in quality of life, work production and decrease medical complaints.  The patient will need continued maintenance and care CPAP supplies (including upgrades as deemed appropriate) in order to continue treatment for sleep apnea as this is medically necessary.    He uses a CPAP machine nightly, improving both his and his wife's sleep quality. Reports feeling less tired with CPAP use, though acknowledges insufficient overall sleep. Discussed potential use of Zepbound  for weight loss, which can aid in sleep apnea management. Zepbound  can result in 15-20% body weight loss, potentially improving sleep apnea symptoms. - Submit prior authorization for Zepbound  2.5 mg weekly dose. - Schedule follow-up after one month of Zepbound  use to assess effectiveness and adjust dosage if necessary.

## 2024-02-28 NOTE — Assessment & Plan Note (Signed)
 Chronic   Discussed potential use of Zepbound  for weight loss, which can aid in sleep apnea management. Zepbound  can result in 15-20% body weight loss, potentially improving sleep apnea symptoms. - Submit prior authorization for Zepbound  2.5 mg weekly dose. - Schedule follow-up after one month of Zepbound  use to assess effectiveness and adjust dosage if necessary.

## 2024-02-28 NOTE — Progress Notes (Signed)
 Complete physical exam   Patient: Mark Curry   DOB: 09-19-1985   38 y.o. Male  MRN: [id number] Visit Date: 02/28/2024  Today's healthcare provider: Rockie Agent, MD   Chief Complaint  Patient presents with   Annual Exam   Care Management    Pattern of eating:General   Are you exercising:Yes  What type of exercising:cardo/weight lifting   How long:1 hour   How frequent:3 to 5 times a week    Vaccine: agreed       Subjective    Mark Curry is a 38 y.o. male who presents today for a complete physical exam.    He does have additional problems to discuss today.   Discussed the use of AI scribe software for clinical note transcription with the patient, who gave verbal consent to proceed.  History of Present Illness Mark Curry is a 38 year old male who presents for an annual physical exam.  He follows a general diet and engages in physical activity three to five times a week for one hour, including cardio and weightlifting.  He uses a CPAP machine nightly for sleep apnea. He reports feeling less tired when he uses the CPAP and notes that the sleep he gets is good, though he typically gets about six hours per night and feels he should get more. He typically gets about six hours of sleep per night and acknowledges the need for more rest.  He has a history of blood-related issues that have caused false alarms in the past and is interested in a referral to a hematologist for future concerns.  He is a [place] national by birthright and [place] border [place] attend his grandfather's funeral. He experiences stress related to political issues affecting immigrants, as his family has various legal statuses.     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)    No pertinent past medical history    Sleep apnea    Past Surgical History:  Procedure Laterality Date   TONSILLECTOMY     Social  History   Socioeconomic History   Marital status: Married    Spouse name: Not on file   Number of children: Not on file   Years of education: Not on file   Highest education level: Doctorate  Occupational History   Not on file  Tobacco Use   Smoking status: Never   Smokeless tobacco: Never  Vaping Use   Vaping status: Never Used  Substance and Sexual Activity   Alcohol use: Not Currently    Alcohol/week: 1.0 standard drink of alcohol    Types: 1 Cans of beer per week   Drug use: Never   Sexual activity: Yes  Other Topics Concern   Not on file  Social History Narrative   Not on file   Social Drivers of Health   Financial Resource Strain: Low Risk  (12/06/2023)   Overall Financial Resource Strain (CARDIA)    Difficulty of Paying Living Expenses: Not hard at all  Food Insecurity: No Food Insecurity (12/06/2023)   Hunger Vital Sign    Worried About Running Out of Food in the Last Year: Never true    Ran Out of Food in the Last Year: Never true  Transportation Needs: No Transportation Needs (12/06/2023)   PRAPARE - Administrator, Civil Service (Medical): No    Lack of Transportation (Non-Medical): No  Physical  Activity: Sufficiently Active (12/06/2023)   Exercise Vital Sign    Days of Exercise per Week: 3 days    Minutes of Exercise per Session: 60 min  Stress: Stress Concern Present (12/06/2023)   [employer] of Occupational Health - Occupational Stress Questionnaire    Feeling of Stress : To some extent  Social Connections: Socially Integrated (12/06/2023)   Social Connection and Isolation Panel    Frequency of Communication with Friends and Family: Three times a week    Frequency of Social Gatherings with Friends and Family: Once a week    Attends Religious Services: More than 4 times per year    Active Member of [employer] or Organizations: Yes    Attends Engineer, structural: More than 4 times per year    Marital Status: Married  Catering manager  Violence: Not At Risk (02/28/2024)   Humiliation, Afraid, Rape, and Kick questionnaire    Fear of Current or Ex-Partner: No    Emotionally Abused: No    Physically Abused: No    Sexually Abused: No   Family Status  Relation Name Status   Mother  Alive   Father  Alive   Sister  (Not Specified)   Brother  (Not Specified)   MGM  (Not Specified)   Neg Hx  (Not Specified)  No partnership data on file   Family History  Problem Relation Age of Onset   Depression Mother    Polycystic ovary syndrome Mother    Diabetes Father    Insulin resistance Sister    Allergic rhinitis Brother    Multiple sclerosis Maternal Grandmother    Angioedema Neg Hx    Asthma Neg Hx    Eczema Neg Hx    Immunodeficiency Neg Hx    Urticaria Neg Hx    Sleep apnea Neg Hx    No Known Allergies   Medications: Outpatient Medications Prior to Visit  Medication Sig   Azelastine -Fluticasone  137-50 MCG/ACT SUSP SHAKE LIQUID AND USE 1 SPRAY IN EACH NOSTRIL TWICE DAILY Nasal for 30 Days   dicyclomine  (BENTYL ) 20 MG tablet Take 1 tablet (20 mg total) by mouth 2 (two) times daily before a meal.   ondansetron  (ZOFRAN -ODT) 4 MG disintegrating tablet Take 1-2 tablets (4-8 mg total) by mouth every 8 (eight) hours as needed for nausea or vomiting.   No facility-administered medications prior to visit.    Review of Systems  Last CBC Lab Results  Component Value Date   WBC 6.0 08/14/2023   HGB 15.2 08/14/2023   HCT 45.4 08/14/2023   MCV 86 08/14/2023   MCH 28.8 08/14/2023   RDW 12.6 08/14/2023   PLT 190 08/14/2023   Last metabolic panel Lab Results  Component Value Date   GLUCOSE 96 04/13/2023   NA 139 04/13/2023   K 4.1 04/13/2023   CL 103 04/13/2023   CO2 25 04/13/2023   BUN 16 04/13/2023   CREATININE 0.89 04/13/2023   EGFR 114 04/13/2023   CALCIUM 9.4 04/13/2023   PROT 7.5 04/13/2023   ALBUMIN 4.5 04/13/2023   LABGLOB 3.0 04/13/2023   AGRATIO 1.7 11/15/2022   BILITOT 0.5 04/13/2023   ALKPHOS  66 04/13/2023   AST 31 04/13/2023   ALT 41 04/13/2023   ANIONGAP 10 02/03/2020   Last lipids Lab Results  Component Value Date   CHOL 132 11/15/2022   HDL 38 (L) 11/15/2022   LDLCALC 69 11/15/2022   TRIG 140 11/15/2022   CHOLHDL 3.5 11/15/2022   The ASCVD  Risk score (Arnett DK, et al., 2019) failed to calculate for the following reasons:   The 2019 ASCVD risk score is only valid for ages 17 to 24  Last hemoglobin A1c Lab Results  Component Value Date   HGBA1C 5.4 11/15/2022   Last thyroid functions No results found for: TSH, T3TOTAL, T4TOTAL, THYROIDAB Last vitamin D No results found for: 25OHVITD2, 25OHVITD3, VD25OH Last vitamin B12 and Folate No results found for: VITAMINB12, FOLATE     Objective    BP 117/75   Pulse 73   Ht 6' (1.829 m)   Wt (!) 309 lb (140.2 kg)   SpO2 96%   BMI 41.91 kg/m  BP Readings from Last 3 Encounters:  02/28/24 117/75  12/06/23 110/70  08/14/23 135/75   Wt Readings from Last 3 Encounters:  02/28/24 (!) 309 lb (140.2 kg)  12/06/23 (!) 304 lb 11.2 oz (138.2 kg)  08/14/23 (!) 314 lb (142.4 kg)        Physical Exam Constitutional:      General: He is not in acute distress.    Appearance: Normal appearance. He is not ill-appearing.  HENT:     Mouth/Throat:     Mouth: Mucous membranes are moist.  Eyes:     General: No scleral icterus.       Right eye: No discharge.        Left eye: No discharge.     Extraocular Movements: Extraocular movements intact.     Conjunctiva/sclera: Conjunctivae normal.     Pupils: Pupils are equal, round, and reactive to light.  Cardiovascular:     Rate and Rhythm: Normal rate and regular rhythm.     Heart sounds: Normal heart sounds.  Pulmonary:     Effort: Pulmonary effort is normal.     Breath sounds: Normal breath sounds.  Abdominal:     General: Bowel sounds are normal. There is no distension.     Palpations: Abdomen is soft.     Tenderness: There is no abdominal  tenderness.  Musculoskeletal:        General: No deformity or signs of injury. Normal range of motion.     Cervical back: Normal range of motion and neck supple. No tenderness.     Right lower leg: No edema.     Left lower leg: No edema.  Lymphadenopathy:     Cervical: No cervical adenopathy.  Neurological:     Mental Status: He is alert and oriented to person, place, and time.     Cranial Nerves: No cranial nerve deficit.     Motor: No weakness.     Gait: Gait normal.  Psychiatric:        Mood and Affect: Mood normal.        Behavior: Behavior normal.       Last depression screening scores    02/28/2024    9:42 AM 08/14/2023    1:46 PM 04/13/2023    2:24 PM  PHQ 2/9 Scores  PHQ - 2 Score 2 0 0  PHQ- 9 Score 4 1     Last fall risk screening    08/14/2023    1:47 PM  Fall Risk   Falls in the past year? 0  Number falls in past yr: 0  Injury with Fall? 0    Last Audit-C alcohol use screening    12/06/2023    1:41 PM  Alcohol Use Disorder Test (AUDIT)  1. How often do you have a drink containing alcohol? 2  2. How many drinks containing alcohol do you have on a typical day when you are drinking? 0  3. How often do you have six or more drinks on one occasion? 0  AUDIT-C Score 2      Patient-reported   A score of 3 or more in women, and 4 or more in men indicates increased risk for alcohol abuse, EXCEPT if all of the points are from question 1   No results found for any visits on 02/28/24.  Assessment & Plan    Routine Health Maintenance and Physical Exam  Immunization History  Administered Date(s) Administered   Influenza,inj,Quad PF,6+ Mos 06/08/2019   Influenza-Unspecified 06/30/2017, 05/27/2018, 06/01/2023   MMR 05/23/2018   Pfizer(Comirnaty)Fall Seasonal Vaccine 12 years and older 06/01/2023   Tdap 08/16/2022    Health Maintenance  Topic Date Due   Hepatitis B Vaccines (1 of 3 - 19+ 3-dose series) Never done   HPV VACCINES (1 - Risk 3-dose SCDM  series) Never done   COVID-19 Vaccine (7 - Pfizer risk 2024-25 season) 11/30/2023   INFLUENZA VACCINE  03/15/2024   DTaP/Tdap/Td (2 - Td or Tdap) 08/16/2032   Hepatitis C Screening  Completed   HIV Screening  Completed   Meningococcal B Vaccine  Aged Out    Problem List Items Addressed This Visit       Respiratory   OSA on CPAP   Chronic  Stable with CPAP use  Patient advised to continue CPAP nightly  The patient has had significant benefit from the use of CPAP machine with considerable improvements in quality of life, work production and decrease medical complaints.  The patient will need continued maintenance and care CPAP supplies (including upgrades as deemed appropriate) in order to continue treatment for sleep apnea as this is medically necessary.    He uses a CPAP machine nightly, improving both his and his wife's sleep quality. Reports feeling less tired with CPAP use, though acknowledges insufficient overall sleep. Discussed potential use of Zepbound  for weight loss, which can aid in sleep apnea management. Zepbound  can result in 15-20% body weight loss, potentially improving sleep apnea symptoms. - Submit prior authorization for Zepbound  2.5 mg weekly dose. - Schedule follow-up after one month of Zepbound  use to assess effectiveness and adjust dosage if necessary.       Relevant Medications   tirzepatide  (ZEPBOUND ) 2.5 MG/0.5ML Pen   tirzepatide  (ZEPBOUND ) 5 MG/0.5ML Pen (Start on 03/28/2024)   Other Relevant Orders   CBC     Hematopoietic and Hemostatic   Thrombocytopenia (HCC)   He has hematological issues with previous false scares with hx of thrombocytopenia. Requests referral to a hematologist for ongoing monitoring. Agreed to proceed with referral regardless of current blood work results. - Refer to a hematologist for further evaluation and monitoring of hematological status.      Relevant Orders   CBC   Ambulatory referral to Hematology / Oncology   CBC  w/Diff/Platelet     Other   Obesity, unspecified   Relevant Medications   tirzepatide  (ZEPBOUND ) 2.5 MG/0.5ML Pen   tirzepatide  (ZEPBOUND ) 5 MG/0.5ML Pen (Start on 03/28/2024)   Morbid obesity with BMI of 40.0-44.9, adult (HCC) - Primary   Chronic   Discussed potential use of Zepbound  for weight loss, which can aid in sleep apnea management. Zepbound  can result in 15-20% body weight loss, potentially improving sleep apnea symptoms. - Submit prior authorization for Zepbound  2.5 mg weekly dose. - Schedule follow-up after one month of Zepbound  use  to assess effectiveness and adjust dosage if necessary.      Relevant Medications   tirzepatide  (ZEPBOUND ) 2.5 MG/0.5ML Pen   tirzepatide  (ZEPBOUND ) 5 MG/0.5ML Pen (Start on 03/28/2024)   Annual physical exam   Annual Physical Examination Routine annual physical examination for a 38 year old male. Discussed general health, exercise routine, and dietary habits. No acute concerns noted during the visit. - Perform metabolic panel to assess liver, kidneys, electrolytes, anemia, platelets, diabetes (A1c), and cholesterol. - Administered hepatitis B vaccine and schedule second dose in one month. - Administered first dose of HPV vaccine and schedule two additional doses.      Other Visit Diagnoses       Screening for deficiency anemia       Relevant Orders   CBC     Screening for diabetes mellitus       Relevant Orders   Hemoglobin A1c     Screening for lipid disorders       Relevant Orders   Lipid panel     Need for HPV vaccine       Relevant Orders   HPV 9-valent vaccine,Recombinat     Has received first dose of hepatitis B vaccine       Relevant Orders   Heplisav-B  (HepB-CPG) Vaccine        Assessment & Plan   General Health Maintenance He follows a general diet and exercises 3-5 times a week, including cardio and weightlifting. Discussed the importance of maintaining a healthy lifestyle and adequate protein intake. - Continue  current exercise routine and dietary habits. - Ensure adequate protein intake in meals.  Follow-up Follow-up plans discussed to monitor health status and treatment effectiveness. - Schedule follow-up appointment in six weeks to review blood work and vaccination status.       Return in about 6 weeks (around 04/10/2024) for Weight MGMT.       Rockie Agent, MD  [facility] [phone] (phone) [phone] (fax)  [facility]

## 2024-02-28 NOTE — Assessment & Plan Note (Signed)
 Annual Physical Examination Routine annual physical examination for a 38 year old male. Discussed general health, exercise routine, and dietary habits. No acute concerns noted during the visit. - Perform metabolic panel to assess liver, kidneys, electrolytes, anemia, platelets, diabetes (A1c), and cholesterol. - Administered hepatitis B vaccine and schedule second dose in one month. - Administered first dose of HPV vaccine and schedule two additional doses.

## 2024-02-29 ENCOUNTER — Ambulatory Visit: Payer: Self-pay | Admitting: Family Medicine

## 2024-02-29 ENCOUNTER — Telehealth: Payer: Self-pay

## 2024-02-29 ENCOUNTER — Other Ambulatory Visit (HOSPITAL_COMMUNITY): Payer: Self-pay

## 2024-02-29 LAB — LIPID PANEL
Chol/HDL Ratio: 3.8 ratio (ref 0.0–5.0)
Cholesterol, Total: 123 mg/dL (ref 100–199)
HDL: 32 mg/dL — ABNORMAL LOW (ref >39)
LDL Chol Calc (NIH): 55 mg/dL (ref 0–99)
Triglycerides: 223 mg/dL — ABNORMAL HIGH (ref 0–149)
VLDL Cholesterol Cal: 36 mg/dL (ref 5–40)

## 2024-02-29 LAB — CBC WITH DIFFERENTIAL/PLATELET
Basophils Absolute: 0.1 x10E3/uL (ref 0.0–0.2)
Basos: 1 %
EOS (ABSOLUTE): 0.3 x10E3/uL (ref 0.0–0.4)
Eos: 5 %
Hematocrit: 48.2 % (ref 37.5–51.0)
Hemoglobin: 15.3 g/dL (ref 13.0–17.7)
Immature Grans (Abs): 0 x10E3/uL (ref 0.0–0.1)
Immature Granulocytes: 0 %
Lymphocytes Absolute: 2.4 x10E3/uL (ref 0.7–3.1)
Lymphs: 39 %
MCH: 27.8 pg (ref 26.6–33.0)
MCHC: 31.7 g/dL (ref 31.5–35.7)
MCV: 88 fL (ref 79–97)
Monocytes Absolute: 0.5 x10E3/uL (ref 0.1–0.9)
Monocytes: 8 %
Neutrophils Absolute: 3 x10E3/uL (ref 1.4–7.0)
Neutrophils: 47 %
Platelets: 192 x10E3/uL (ref 150–450)
RBC: 5.51 x10E6/uL (ref 4.14–5.80)
RDW: 12.9 % (ref 11.6–15.4)
WBC: 6.2 x10E3/uL (ref 3.4–10.8)

## 2024-02-29 LAB — HEMOGLOBIN A1C
Est. average glucose Bld gHb Est-mCnc: 111 mg/dL
Hgb A1c MFr Bld: 5.5 % (ref 4.8–5.6)

## 2024-02-29 NOTE — Telephone Encounter (Signed)
 Pharmacy Patient Advocate Encounter   Received notification from Physician's Office that prior authorization for Zepbound  2.5mg /0.76ml Pen is required/requested.   Insurance verification completed.   The patient is insured through [facility] ADVANTAGE/RX ADVANCE .   Per test claim: The current 28 day co-pay is, $1,035.19.  No PA needed at this time. This test claim was processed through [facility]- copay amounts may vary at other pharmacies due to pharmacy/plan contracts, or as the patient moves through the different stages of their insurance plan.

## 2024-03-11 ENCOUNTER — Encounter: Payer: Self-pay | Admitting: Family Medicine

## 2024-03-19 ENCOUNTER — Inpatient Hospital Stay: Attending: Oncology | Admitting: Oncology

## 2024-03-19 ENCOUNTER — Inpatient Hospital Stay

## 2024-03-19 ENCOUNTER — Encounter: Payer: Self-pay | Admitting: Oncology

## 2024-03-19 VITALS — BP 120/82 | HR 66 | Temp 98.5°F | Resp 18 | Ht 72.0 in | Wt 305.0 lb

## 2024-03-19 DIAGNOSIS — D693 Immune thrombocytopenic purpura: Secondary | ICD-10-CM | POA: Insufficient documentation

## 2024-03-19 NOTE — Progress Notes (Unsigned)
 [facility]  Telephone:([phone]) [phone] Fax:([phone]) [phone]  ID: Mark Curry OB: March 02, 1986  MR#: [id number]  RDW#:[id number]  Patient Care Team: Sharma Coyer, MD as PCP - General (Family Medicine) Debera Jayson MATSU, MD as PCP - Cardiology (Cardiology)  CHIEF COMPLAINT: History of ITP.  INTERVAL HISTORY: Patient is a 38 year old male who has a distant history of ITP and June 2021.  Patient received an extended prednisone  taper of nearly 3 months with resolution of his platelet count.  He has been in complete remission ever since.  He has recently moved to [place] and is referred to establish care.  He currently feels well and is asymptomatic.  He has no neurologic complaints.  He denies any recent fevers or illnesses.  He has a good appetite and denies weight loss.  He has no chest pain, shortness of breath, cough, or hemoptysis.  He denies any nausea, vomiting, constipation, or diarrhea.  He has no urinary complaints.  Patient offers no specific complaints today.  REVIEW OF SYSTEMS:   Review of Systems  Constitutional: Negative.  Negative for fever, malaise/fatigue and weight loss.  Respiratory: Negative.  Negative for cough, hemoptysis and shortness of breath.   Cardiovascular: Negative.  Negative for chest pain and leg swelling.  Gastrointestinal:  Negative for abdominal pain.  Genitourinary: Negative.  Negative for dysuria.  Musculoskeletal: Negative.  Negative for back pain.  Skin: Negative.  Negative for rash.  Neurological: Negative.  Negative for dizziness, focal weakness, weakness and headaches.  Psychiatric/Behavioral: Negative.  The patient is not nervous/anxious.     As per HPI. Otherwise, a complete review of systems is negative.  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)    No pertinent past medical history    Sleep apnea     PAST SURGICAL  HISTORY: Past Surgical History:  Procedure Laterality Date   TONSILLECTOMY      FAMILY HISTORY: Family History  Problem Relation Age of Onset   Depression Mother    Polycystic ovary syndrome Mother    Diabetes Father    Insulin resistance Sister    Allergic rhinitis Brother    Multiple sclerosis Maternal Grandmother    Angioedema Neg Hx    Asthma Neg Hx    Eczema Neg Hx    Immunodeficiency Neg Hx    Urticaria Neg Hx    Sleep apnea Neg Hx     ADVANCED DIRECTIVES (Y/N):  N  HEALTH MAINTENANCE: Social History   Tobacco Use   Smoking status: Never   Smokeless tobacco: Never  Vaping Use   Vaping status: Never Used  Substance Use Topics   Alcohol use: Not Currently    Alcohol/week: 1.0 standard drink of alcohol    Types: 1 Cans of beer per week   Drug use: Never     Colonoscopy:  PAP:  Bone density:  Lipid panel:  No Known Allergies  Current Outpatient Medications  Medication Sig Dispense Refill   Azelastine -Fluticasone  137-50 MCG/ACT SUSP SHAKE LIQUID AND USE 1 SPRAY IN EACH NOSTRIL TWICE DAILY Nasal for 30 Days     No current facility-administered medications for this visit.    OBJECTIVE: Vitals:   03/19/24 1122  BP: 120/82  Pulse: 66  Resp: 18  Temp: 98.5 F (36.9 C)  SpO2: 92%     Body mass index is 41.37 kg/m.    ECOG FS:0 - Asymptomatic  General: Well-developed, well-nourished, no  acute distress. Eyes: Pink conjunctiva, anicteric sclera. HEENT: Normocephalic, moist mucous membranes. Lungs: No audible wheezing or coughing. Heart: Regular rate and rhythm. Abdomen: Soft, nontender, no obvious distention. Musculoskeletal: No edema, cyanosis, or clubbing. Neuro: Alert, answering all questions appropriately. Cranial nerves grossly intact. Skin: No rashes or petechiae noted. Psych: Normal affect. Lymphatics: No cervical, calvicular, axillary or inguinal LAD.   LAB RESULTS:  Lab Results  Component Value Date   NA 139 04/13/2023   K 4.1  04/13/2023   CL 103 04/13/2023   CO2 25 04/13/2023   GLUCOSE 96 04/13/2023   BUN 16 04/13/2023   CREATININE 0.89 04/13/2023   CALCIUM 9.4 04/13/2023   PROT 7.5 04/13/2023   ALBUMIN 4.5 04/13/2023   AST 31 04/13/2023   ALT 41 04/13/2023   ALKPHOS 66 04/13/2023   BILITOT 0.5 04/13/2023   GFRNONAA >60 02/03/2020   GFRAA >60 02/03/2020    Lab Results  Component Value Date   WBC 6.2 02/28/2024   NEUTROABS 3.0 02/28/2024   HGB 15.3 02/28/2024   HCT 48.2 02/28/2024   MCV 88 02/28/2024   PLT 192 02/28/2024     STUDIES: No results found.  ASSESSMENT: History of ITP.  PLAN:    History of ITP: Patient's platelet count dropped to 6 in June 2021.  His platelets fully recovered with an extended prednisone  taper and he has been in complete remission since that time.  It does not appear he received any other treatments.  His most recent platelet count is 192.  No intervention is needed.  Patient does not require bone marrow biopsy.  No further follow-up has been scheduled.  Please refer patient back if there are any questions or concerns.  I spent a total of 45 minutes reviewing chart data, face-to-face evaluation with the patient, counseling and coordination of care as detailed above.   Patient expressed understanding and was in agreement with this plan. He also understands that He can call clinic at any time with any questions, concerns, or complaints.    Evalene JINNY Reusing, MD   03/19/2024 11:47 AM

## 2024-03-19 NOTE — Progress Notes (Unsigned)
 Patient is doing ok, he does have a couple of questions for the provider.

## 2024-04-02 ENCOUNTER — Encounter: Admitting: Family Medicine

## 2024-04-23 ENCOUNTER — Ambulatory Visit: Admitting: Family Medicine

## 2024-04-30 ENCOUNTER — Ambulatory Visit: Admitting: Family Medicine

## 2024-04-30 ENCOUNTER — Encounter: Payer: Self-pay | Admitting: Family Medicine

## 2024-04-30 VITALS — BP 121/76 | HR 80 | Temp 98.4°F | Ht 72.0 in | Wt 292.0 lb

## 2024-04-30 DIAGNOSIS — F5081 Binge eating disorder, mild: Secondary | ICD-10-CM

## 2024-04-30 DIAGNOSIS — Z6839 Body mass index (BMI) 39.0-39.9, adult: Secondary | ICD-10-CM

## 2024-04-30 DIAGNOSIS — R41840 Attention and concentration deficit: Secondary | ICD-10-CM

## 2024-04-30 DIAGNOSIS — E66812 Obesity, class 2: Secondary | ICD-10-CM

## 2024-04-30 DIAGNOSIS — G4733 Obstructive sleep apnea (adult) (pediatric): Secondary | ICD-10-CM

## 2024-04-30 DIAGNOSIS — Z23 Encounter for immunization: Secondary | ICD-10-CM | POA: Diagnosis not present

## 2024-04-30 MED ORDER — LISDEXAMFETAMINE DIMESYLATE 30 MG PO CAPS: 30.0000 mg | ORAL_CAPSULE | Freq: Every day | ORAL | 0 refills | Status: AC

## 2024-04-30 MED ORDER — SEMAGLUTIDE-WEIGHT MANAGEMENT 0.25 MG/0.5ML ~~LOC~~ SOAJ: 0.2500 mg | SUBCUTANEOUS | 0 refills | Status: AC

## 2024-04-30 NOTE — Patient Instructions (Signed)
 To keep you healthy, please keep in mind the following health maintenance items that you are due for:   Health Maintenance Due  Topic Date Due   Influenza Vaccine  03/15/2024   Hepatitis B Vaccines 19-59 Average Risk (2 of 2 - CpG 2-dose series) 03/27/2024   HPV VACCINES (2 - Risk male 3-dose series) 03/27/2024     Best Wishes,   Dr. Lang

## 2024-04-30 NOTE — Progress Notes (Signed)
 "     Established patient visit   Patient: Mark Curry   DOB: 1985-09-12   38 y.o. Male  MRN: [id number] Visit Date: 04/30/2024  Today's healthcare provider: Rockie Agent, MD   Chief Complaint  Patient presents with   Medical Management of Chronic Issues    Patient is present for weight management follow up, reports doing well.  No acute concerns today, would like to receive flu, second HPV and second Hep b   Subjective     HPI     Medical Management of Chronic Issues    Additional comments: Patient is present for weight management follow up, reports doing well.  No acute concerns today, would like to receive flu, second HPV and second Hep b      Last edited by Cherry Chiquita HERO, CMA on 04/30/2024  1:29 PM.       Discussed the use of AI scribe software for clinical note transcription with the patient, who gave verbal consent to proceed.  History of Present Illness Mark Curry is a 38 year old male with obesity and sleep apnea who presents for follow-up management of his weight loss and medication regimen.  He has been managing his weight through lifestyle changes and medication. Previously prescribed Zepbound  2.5 mg weekly, he wasn't able to start pharmacotherapy due to cost concerns. Despite this, he has lost weight from 305 pounds to 285 pounds at home, and 292 pounds in the clinic, attributing this to dietary changes such as eating slower and being more mindful of his food intake. He has started seeing a nutritionist for the past two weeks, which he finds helpful.  He has a history of sleep apnea, which is relevant to his weight management. He is exploring medication options that might be covered by insurance, including Wegovy  and Vyvanse , due to the high cost of Zepbound . His insurance, [employer], might cover some medications under certain conditions related to his BMI and sleep apnea.  He discusses his relationship with food, noting that he tends to eat when  stressed or bored, and has been working on changing these habits. He is trying to eat slower and chew more thoroughly, which has become more natural over time. He engages in physical activity, such as walking, to help manage his weight. He reports eating patterns including eating too much too fast   He has a history of using smart devices to track biometrics, including heart rate and VO2 levels, which he finds to be consistently low. He engages in morning walks to improve his cardiovascular health.  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    Review of Systems  Last metabolic panel Lab Results  Component Value Date   GLUCOSE 96 04/13/2023   NA 139 04/13/2023   K 4.1 04/13/2023   CL 103 04/13/2023   CO2 25 04/13/2023   BUN 16 04/13/2023   CREATININE 0.89 04/13/2023   EGFR 114 04/13/2023   CALCIUM 9.4 04/13/2023   PROT 7.5 04/13/2023   ALBUMIN 4.5  04/13/2023   LABGLOB 3.0 04/13/2023   AGRATIO 1.7 11/15/2022   BILITOT 0.5 04/13/2023   ALKPHOS 66 04/13/2023   AST 31 04/13/2023   ALT 41 04/13/2023   ANIONGAP 10 02/03/2020   Last lipids Lab Results  Component Value Date   CHOL 123 02/28/2024   HDL 32 (L) 02/28/2024   LDLCALC 55 02/28/2024   TRIG 223 (H) 02/28/2024   CHOLHDL 3.8 02/28/2024   Last hemoglobin A1c Lab Results  Component Value Date   HGBA1C 5.5 02/28/2024   Last thyroid functions No results found for: TSH, T3TOTAL, T4TOTAL, THYROIDAB      Objective    BP 121/76 (BP Location: Left Arm, Patient Position: Sitting, Cuff Size:  Normal)   Pulse 80   Temp 98.4 F (36.9 C) (Oral)   Ht 6' (1.829 m)   Wt 292 lb (132.5 kg)   SpO2 97%   BMI 39.60 kg/m   BP Readings from Last 3 Encounters:  04/30/24 121/76  03/19/24 120/82  02/28/24 117/75   Wt Readings from Last 3 Encounters:  04/30/24 292 lb (132.5 kg)  03/19/24 (!) 305 lb (138.3 kg)  02/28/24 (!) 309 lb (140.2 kg)        Physical Exam Vitals reviewed.  Constitutional:      General: He is not in acute distress.    Appearance: Normal appearance. He is not ill-appearing.  Cardiovascular:     Rate and Rhythm: Normal rate and regular rhythm.  Pulmonary:     Effort: Pulmonary effort is normal. No respiratory distress.     Breath sounds: No wheezing, rhonchi or rales.  Neurological:     Mental Status: He is alert and oriented to person, place, and time.  Psychiatric:        Mood and Affect: Mood normal.        Behavior: Behavior normal.       No results found for any visits on 04/30/24.  Assessment & Plan     Problem List Items Addressed This Visit     Attention and concentration deficit   Suspected attention-deficit hyperactivity disorder (ADHD) Suspected ADHD based on self-reported symptoms and history associated with binge eating. Vyvanse  considered for both appetite suppression and potential ADHD symptom management. He has not completed ADHD assessment form provided six months ago. - Encourage completion of ADHD assessment form. - start Vyvanse  30 mg daily for potential ADHD sym      Mild binge-eating disorder - Primary   Chronic  Start Vyvanse  30mg  daily       Relevant Medications   lisdexamfetamine  (VYVANSE ) 30 MG capsule   Obesity, Class II, BMI 35-39.9   Obesity, Class II, BMI 39  with suspected binge eating disorder and obstructive sleep apnea Obesity with a BMI of 39.6, down from 41, indicating significant weight loss. Suspected binge eating disorder contributing to weight issues. Obstructive sleep apnea present. - Submit  prior authorization for Wegovy  0.25 mg weekly. - start Vyvanse  30 mg daily for appetite suppression and potential ADHD symptoms. - Monitor blood pressure and heart rate due to potential side effects of Vyvanse , including increased blood pressure, heart rate, insomnia, and headaches. - Encourage continued lifestyle modifications and nutritional counseling.      Relevant Medications   semaglutide -weight management (WEGOVY ) 0.25 MG/0.5ML SOAJ SQ injection   lisdexamfetamine  (VYVANSE ) 30 MG capsule   OSA on CPAP   Chronic  Stable with CPAP use  Patient advised to continue CPAP nightly  The patient has had significant benefit  from the use of CPAP machine with considerable improvements in quality of life, work production and decrease medical complaints.  The patient will need continued maintenance and care CPAP supplies (including upgrades as deemed appropriate) in order to continue treatment for sleep apnea as this is medically necessary.         Other Visit Diagnoses       Immunization due       Relevant Orders   HPV 9-valent vaccine,Recombinat (Completed)   Heplisav-B  (HepB-CPG) Vaccine (Completed)   Flu vaccine trivalent PF, 6mos and older(Flulaval,Afluria,Fluarix,Fluzone) (Completed)       Assessment and Plan Assessment & Plan        Return in about 4 months (around 08/30/2024) for Weight MGMT.         Rockie Agent, MD  [facility] [phone] (phone) [phone] (fax)  [facility] "

## 2024-05-02 DIAGNOSIS — F5081 Binge eating disorder, mild: Secondary | ICD-10-CM | POA: Insufficient documentation

## 2024-05-02 NOTE — Assessment & Plan Note (Signed)
 Suspected attention-deficit hyperactivity disorder (ADHD) Suspected ADHD based on self-reported symptoms and history associated with binge eating. Vyvanse  considered for both appetite suppression and potential ADHD symptom management. He has not completed ADHD assessment form provided six months ago. - Encourage completion of ADHD assessment form. - start Vyvanse  30 mg daily for potential ADHD sym

## 2024-05-02 NOTE — Assessment & Plan Note (Signed)
 Obesity, Class II, BMI 39  with suspected binge eating disorder and obstructive sleep apnea Obesity with a BMI of 39.6, down from 41, indicating significant weight loss. Suspected binge eating disorder contributing to weight issues. Obstructive sleep apnea present. - Submit prior authorization for Wegovy  0.25 mg weekly. - start Vyvanse  30 mg daily for appetite suppression and potential ADHD symptoms. - Monitor blood pressure and heart rate due to potential side effects of Vyvanse , including increased blood pressure, heart rate, insomnia, and headaches. - Encourage continued lifestyle modifications and nutritional counseling.

## 2024-05-02 NOTE — Assessment & Plan Note (Signed)
Chronic  Stable with CPAP use  Patient advised to continue CPAP nightly  The patient has had significant benefit from the use of CPAP machine with considerable improvements in quality of life, work production and decrease medical complaints.  The patient will need continued maintenance and care CPAP supplies (including upgrades as deemed appropriate) in order to continue treatment for sleep apnea as this is medically necessary.     

## 2024-05-02 NOTE — Assessment & Plan Note (Signed)
 Chronic  Start Vyvanse  30mg  daily

## 2024-05-06 ENCOUNTER — Other Ambulatory Visit (HOSPITAL_COMMUNITY): Payer: Self-pay

## 2024-05-06 ENCOUNTER — Telehealth: Payer: Self-pay | Admitting: Pharmacy Technician

## 2024-05-06 NOTE — Telephone Encounter (Signed)
 Pharmacy Patient Advocate Encounter   Received notification from Onbase that prior authorization for Lisdexamfetamine Dimesylate  30MG  capsules is required/requested.   Insurance verification completed.   The patient is insured through [facility] .   Per test claim: PA required; PA submitted to above mentioned insurance via Latent Key/confirmation #/EOC [id number] Status is pending

## 2024-05-07 ENCOUNTER — Other Ambulatory Visit (HOSPITAL_COMMUNITY): Payer: Self-pay

## 2024-05-07 NOTE — Telephone Encounter (Signed)
 Pharmacy Patient Advocate Encounter  Received notification from [facility] that Prior Authorization for Lisdexamfetamine Dimesylate  30MG  capsules has been DENIED.  Full denial letter will be uploaded to the media tab. See denial reason below.   PA #/Case ID/Reference #: [id number]

## 2024-05-08 ENCOUNTER — Telehealth: Payer: Self-pay

## 2024-05-08 NOTE — Telephone Encounter (Signed)
 Attempting resubmission, if resubmission doesn't work, we will proceed with appeal. Thank you

## 2024-05-08 NOTE — Telephone Encounter (Signed)
 Could we submit for an appeal.   This medication was for treatment of binge eating disorder since he has not been formally evaluated for a dgx of ADHD

## 2024-05-08 NOTE — Telephone Encounter (Signed)
PA has been approved, thank you!

## 2024-05-08 NOTE — Telephone Encounter (Signed)
 Pharmacy Patient Advocate Encounter   Received notification from Physician's Office that prior authorization for Lisdexamfetamine Dimesylate  30MG  capsules is required/requested.   Insurance verification completed.   The patient is insured through [facility] .   Per test claim: PA required and submitted KEY/EOC/Request #: [id number] APPROVED from 05/08/2024 to 05/08/2025

## 2024-05-31 ENCOUNTER — Ambulatory Visit

## 2024-05-31 ENCOUNTER — Ambulatory Visit: Admitting: Family Medicine

## 2024-05-31 VITALS — BP 133/85 | HR 91 | Ht 72.0 in | Wt 287.2 lb

## 2024-05-31 DIAGNOSIS — J019 Acute sinusitis, unspecified: Secondary | ICD-10-CM | POA: Insufficient documentation

## 2024-05-31 MED ORDER — AMOXICILLIN-POT CLAVULANATE 875-125 MG PO TABS: 1.0000 | ORAL_TABLET | Freq: Two times a day (BID) | ORAL | 0 refills | Status: AC

## 2024-05-31 NOTE — Assessment & Plan Note (Signed)
 Patient with 4 day hx of cough, congestion, sinus pressure, headache, ear ache, and mild sore throat. Most likely 2/2 to viral illness, but given worsening symptoms cannot rule out acute sinusitis. Low concern for pneumonia. - Recommend supportive care at home including rest, hydration, Mucinex, tylenol  PRN - Will prescribe Augmentin  x 7 days for patient to start taking if not feeling better over the weekend - Return precautions given

## 2024-05-31 NOTE — Progress Notes (Signed)
      Acute visit   Patient: Mark Curry   DOB: [birth year]   38 y.o. Male  MRN: [id number] PCP: Sharma Coyer, MD   Chief Complaint  Patient presents with   Acute Visit    Patient is present due to sore throat and sinus pressure X Tuesday/Wednesday morning. He reports taking home covid test yesterday that was negative.Reports green phlegm at times when coughing. Took sudaffed last night. No fevers that he is aware of.    Sore Throat   Subjective      - Started on Tuesday - Endorses headache, sinus pressure, sore throat - Flew on a plane yesterday and his ears were popping - Reports congestion, coughing up green phlegm - Denies fevers, chills, dyspnea   Review of systems as noted in HPI.   Objective    BP 133/85 (BP Location: Left Arm, Patient Position: Sitting, Cuff Size: Normal)   Pulse 91   Ht 6' (1.829 m)   Wt 287 lb 3.2 oz (130.3 kg)   SpO2 100%   BMI 38.95 kg/m  Physical Exam Constitutional:      Appearance: Normal appearance.  HENT:     Head: Normocephalic and atraumatic.     Right Ear: A middle ear effusion is present.     Left Ear: A middle ear effusion is present.     Nose: Congestion and rhinorrhea present.     Mouth/Throat:     Mouth: Mucous membranes are moist.     Pharynx: Posterior oropharyngeal erythema present. No oropharyngeal exudate.  Eyes:     Pupils: Pupils are equal, round, and reactive to light.  Cardiovascular:     Rate and Rhythm: Normal rate and regular rhythm.     Heart sounds: Normal heart sounds.  Pulmonary:     Effort: Pulmonary effort is normal.     Breath sounds: Normal breath sounds.  Skin:    General: Skin is warm.  Neurological:     General: No focal deficit present.     Mental Status: He is alert.       No results found for any visits on 05/31/24.  Assessment & Plan     Problem List Items Addressed This Visit       Respiratory   Acute non-recurrent sinusitis - Primary   Patient with 4 day hx of  cough, congestion, sinus pressure, headache, ear ache, and mild sore throat. Most likely 2/2 to viral illness, but given worsening symptoms cannot rule out acute sinusitis. Low concern for pneumonia. - Recommend supportive care at home including rest, hydration, Mucinex, tylenol  PRN - Will prescribe Augmentin  x 7 days for patient to start taking if not feeling better over the weekend - Return precautions given      Relevant Medications   amoxicillin -clavulanate (AUGMENTIN ) 875-125 MG tablet    Meds ordered this encounter  Medications   amoxicillin -clavulanate (AUGMENTIN ) 875-125 MG tablet    Sig: Take 1 tablet by mouth 2 (two) times daily for 7 days.    Dispense:  14 tablet    Refill:  0     No follow-ups on file.      Isaiah DELENA Pepper, MD  [facility] [phone] (phone) [phone] (fax)

## 2024-06-04 NOTE — Progress Notes (Unsigned)
 [facility] [address] [place]. [place] [address] [phone]       OFFICE FOLLOW UP NOTE  [name] Date of Birth:  Nov 28, 1985 Medical Record Number:  [id number]    Primary neurologist: Dr. Buck Reason for visit: CPAP follow-up    Virtual Visit via Video Note  I connected with Mark Curry on 06/05/24 at  1:45 PM EDT by a video enabled telemedicine application and verified that I am speaking with the correct person using two identifiers.  Location: Patient: at home Provider: in office, [facility]   I discussed the limitations of evaluation and management by telemedicine and the availability of in person appointments. The patient expressed understanding and agreed to proceed.    SUBJECTIVE:   Follow-up visit:  Prior visit: 06/06/2022  Brief HPI:   Mark Curry is a 38 y.o. male who is followed for OSA on CPAP.  Diagnosed with sleep apnea after sleep study in 09/2021 which showed mild OSA at 10.1/h although severe severe REM AHI 62.3/h and severe supine REM sleep related desats with O2 nadir 78%.  AutoPap initiated 10/2021.     Interval history:  Patient returns for overdue CPAP compliance visit.  Compliance report as below showing excellent usage and optimal residual AHI.  Reports overall doing well on CPAP therapy.  He reports continued benefit.  He may miss some nights occasionally and will notice a difference after not using.  He has been working on weight loss and has lost approximately 30 pounds since sleep study.  He does not have a specific goal weight in mind.  He is now followed by [facility] after insurance changes (previously followed by [employer]). No further questions or concerns at this time.       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)    No pertinent past  medical history    Sleep apnea     PSH:  Past Surgical History:  Procedure Laterality Date   TONSILLECTOMY      Social History:  Social History   Socioeconomic History   Marital status: Married    Spouse name: Not on file   Number of children: Not on file   Years of education: Not on file   Highest education level: Doctorate  Occupational History   Not on file  Tobacco Use   Smoking status: Never   Smokeless tobacco: Never  Vaping Use   Vaping status: Never Used  Substance and Sexual Activity   Alcohol use: Not Currently    Alcohol/week: 1.0 standard drink of alcohol    Types: 1 Cans of beer per week   Drug use: Never   Sexual activity: Yes  Other Topics Concern   Not on file  Social History Narrative   Not on file   Social Drivers of Health   Financial Resource Strain: Low Risk  (12/06/2023)   Overall Financial Resource Strain (CARDIA)    Difficulty of Paying Living Expenses: Not hard at all  Food Insecurity: No Food Insecurity (12/06/2023)   Hunger Vital Sign    Worried About Running Out of Food in the Last Year: Never true    Ran Out of Food in the Last Year: Never true  Transportation Needs: No Transportation Needs (12/06/2023)   PRAPARE - Transportation  Lack of Transportation (Medical): No    Lack of Transportation (Non-Medical): No  Physical Activity: Sufficiently Active (12/06/2023)   Exercise Vital Sign    Days of Exercise per Week: 3 days    Minutes of Exercise per Session: 60 min  Stress: Stress Concern Present (12/06/2023)   [employer] of Occupational Health - Occupational Stress Questionnaire    Feeling of Stress : To some extent  Social Connections: Socially Integrated (12/06/2023)   Social Connection and Isolation Panel    Frequency of Communication with Friends and Family: Three times a week    Frequency of Social Gatherings with Friends and Family: Once a week    Attends Religious Services: More than 4 times per year    Active Member  of [employer] or Organizations: Yes    Attends Engineer, structural: More than 4 times per year    Marital Status: Married  Catering manager Violence: Not At Risk (02/28/2024)   Humiliation, Afraid, Rape, and Kick questionnaire    Fear of Current or Ex-Partner: No    Emotionally Abused: No    Physically Abused: No    Sexually Abused: No    Family History:  Family History  Problem Relation Age of Onset   Depression Mother    Polycystic ovary syndrome Mother    ADD / ADHD Mother    Diabetes Father    Insulin resistance Sister    ADD / ADHD Sister    Allergic rhinitis Brother    ADD / ADHD Brother    Multiple sclerosis Maternal Grandmother    Diabetes Maternal Uncle    Angioedema Neg Hx    Asthma Neg Hx    Eczema Neg Hx    Immunodeficiency Neg Hx    Urticaria Neg Hx    Sleep apnea Neg Hx     Medications:   Current Outpatient Medications on File Prior to Visit  Medication Sig Dispense Refill   fluticasone  (FLONASE ) 50 MCG/ACT nasal spray Place 1 spray into both nostrils daily.     levocetirizine (XYZAL) 5 MG tablet Take 5 mg by mouth every evening.     amoxicillin -clavulanate (AUGMENTIN ) 875-125 MG tablet Take 1 tablet by mouth 2 (two) times daily for 7 days. 14 tablet 0   Azelastine -Fluticasone  137-50 MCG/ACT SUSP SHAKE LIQUID AND USE 1 SPRAY IN EACH NOSTRIL TWICE DAILY Nasal for 30 Days     lisdexamfetamine (VYVANSE ) 30 MG capsule Take 1 capsule (30 mg total) by mouth daily. 30 capsule 0   montelukast (SINGULAIR) 10 MG tablet Take 10 mg by mouth daily.     No current facility-administered medications on file prior to visit.    Allergies:  No Known Allergies    OBJECTIVE:  Physical Exam  Vitals:   06/05/24 1351  Weight: 276 lb (125.2 kg)  Height: 6' (1.829 m)   Body mass index is 37.43 kg/m. (Initial sleep study BMI 41.15) No results found.   General: well developed, well nourished, seated, in no evident distress  Neurologic Exam Mental Status: Awake  and fully alert. Oriented to place and time. Recent and remote memory intact. Attention span, concentration and fund of knowledge appropriate. Mood and affect appropriate.        ASSESSMENT/PLAN: Mark Curry is a 38 y.o. year old male    OSA on CPAP :  Compliance report shows satisfactory usage with optimal residual AHI.   Continue current pressure settings of 7-17 with EPR 3 Congratulated on weight loss accomplishments thus far of  approximately 30 pounds.  Discussed continued weight loss, consider repeat sleep study after further weight loss (based on his severe degree of sleep apnea) to see if apnea is still present Discussed continued nightly usage with ensuring greater than 4 hours nightly for optimal benefit and per insurance purposes.   Continue to follow with DME company [facility] for any needed supplies or CPAP related concerns CPAP set up 10/2021, due for new machine 10/2026     Follow up in 1 year via MyChart video visit or call earlier if needed   CC:  PCP: Simmons-Robinson, Makiera, MD    I personally spent a total of 25 minutes in the care of the patient today including preparing to see the patient, counseling and educating, placing orders, and documenting clinical information in the EHR.  Harlene Bogaert, AGNP-BC  [facility] [address] [place], [place] [address]  Phone [phone] Fax [phone] Note: This document was prepared with digital dictation and possible smart phrase technology. Any transcriptional errors that result from this process are unintentional.

## 2024-06-05 ENCOUNTER — Telehealth: Admitting: Adult Health

## 2024-06-05 ENCOUNTER — Encounter: Payer: Self-pay | Admitting: Adult Health

## 2024-06-05 VITALS — Ht 72.0 in | Wt 276.0 lb

## 2024-06-05 DIAGNOSIS — G4733 Obstructive sleep apnea (adult) (pediatric): Secondary | ICD-10-CM | POA: Diagnosis not present

## 2024-06-17 NOTE — Telephone Encounter (Signed)
 Recommend patient be seen in clinic for persistent cough.

## 2024-06-18 ENCOUNTER — Ambulatory Visit: Admitting: Family Medicine

## 2024-06-18 ENCOUNTER — Encounter: Payer: Self-pay | Admitting: Family Medicine

## 2024-06-18 VITALS — BP 111/68 | HR 76 | Temp 98.4°F | Ht 72.0 in | Wt 280.9 lb

## 2024-06-18 DIAGNOSIS — R053 Chronic cough: Secondary | ICD-10-CM

## 2024-06-18 MED ORDER — BENZONATATE 200 MG PO CAPS: 200.0000 mg | ORAL_CAPSULE | Freq: Two times a day (BID) | ORAL | 0 refills | Status: AC | PRN

## 2024-06-18 NOTE — Progress Notes (Signed)
 ACUTE VISIT   Patient: Mark Curry   DOB: 1985-10-14   38 y.o. Male  MRN: [id number]   PCP: Sharma Coyer, MD  Chief Complaint  Patient presents with   Cough    Patient is present with productive cough green mucus onset x 2-3 weeks. Seen 10/17 for sinusitis and completed course of abx. Patient notes that productive cough is lingering while other symptoms have cleared.  Denies CP, SOB or tightness. Does observe some rattling in chest when coughing. Worse in the morning and when lying down.    Subjective    HPI HPI     Cough    Additional comments: Patient is present with productive cough green mucus onset x 2-3 weeks. Seen 10/17 for sinusitis and completed course of abx. Patient notes that productive cough is lingering while other symptoms have cleared.  Denies CP, SOB or tightness. Does observe some rattling in chest when coughing. Worse in the morning and when lying down.       Last edited by Cherry Chiquita HERO, CMA on 06/18/2024  3:19 PM.       Discussed the use of AI scribe software for clinical note transcription with the patient, who gave verbal consent to proceed.  History of Present Illness Mark Curry is a 38 year old male who presents with a persistent cough.  He has been experiencing a persistent productive cough for the past three weeks. He completed a seven-day course of Augmentin  for acute sinusitis two weeks ago, but the cough has persisted while other symptoms have resolved. The cough is worse in the morning and when lying down, accompanied by a rattling sensation in his chest. No chest pain, shortness of breath, or chest tightness. The cough has not significantly impacted his daily activities, as he was able to go to the gym before the visit.  He has been using honey to help manage the cough. He recalls using Tessalon  Perles in the past for a cough.  He has been seeing a dietitian and has lost weight, currently weighing 272 pounds, down from  280 pounds. He has a history of sleep apnea, and his weight loss may warrant a re-evaluation in the future.    Medications: Outpatient Medications Prior to Visit  Medication Sig   Azelastine -Fluticasone  137-50 MCG/ACT SUSP SHAKE LIQUID AND USE 1 SPRAY IN EACH NOSTRIL TWICE DAILY Nasal for 30 Days   fluticasone  (FLONASE ) 50 MCG/ACT nasal spray Place 1 spray into both nostrils daily.   levocetirizine (XYZAL) 5 MG tablet Take 5 mg by mouth every evening.   montelukast (SINGULAIR) 10 MG tablet Take 10 mg by mouth daily.   lisdexamfetamine (VYVANSE ) 30 MG capsule Take 1 capsule (30 mg total) by mouth daily. (Patient not taking: Reported on 06/18/2024)   No facility-administered medications prior to visit.        Objective    BP 111/68 (BP Location: Right Arm, Patient Position: Sitting, Cuff Size: Normal)   Pulse 76   Temp 98.4 F (36.9 C) (Oral)   Ht 6' (1.829 m)   Wt 280 lb 14.4 oz (127.4 kg)   SpO2 98%   BMI 38.10 kg/m  BP Readings from Last 3 Encounters:  06/18/24 111/68  05/31/24 133/85  04/30/24 121/76   Wt Readings from Last 3 Encounters:  06/18/24 280 lb 14.4 oz (127.4 kg)  06/05/24 276 lb (125.2 kg)  05/31/24 287 lb 3.2 oz (130.3 kg)      Physical Exam  Physical Exam VITALS: SaO2- 98% MEASUREMENTS: Weight- 272. CHEST: Bilateral diffuse coarse breath sounds, no wheezes, no crackles, normal respiratory effort.   No results found for any visits on 06/18/24.  Assessment & Plan     Assessment and Plan Assessment & Plan Chronic productive cough Persistent productive cough for three weeks following treatment for acute sinusitis with Augmentin . Cough is worse in the morning and when lying down, with rattling in the chest. No chest pain, shortness of breath, or chest tightness. Oxygen saturation is 98%. Likely post-infectious cough, which can last up to two months. Bilateral diffuse coarse breath sounds, no wheezing or crackles, normal respiratory rate, and no  respiratory distress. Productive cough throughout the exam. - Recommended Mucinex for cough relief. - Advised use of honey throughout the day to naturally suppress the cough. - Prescribed Tessalon  Perles 200 mg every 12 hours as needed for cough. - If cough persists beyond mid-December or worsens with new symptoms such as fever, chills, or fatigue, will consider ordering a chest x-ray and possibly another round of antibiotics.      Return if symptoms worsen or fail to improve.        Rockie Agent, MD  [facility] [phone] (phone) [phone] (fax)  [facility]

## 2024-06-18 NOTE — Patient Instructions (Signed)
 To keep you healthy, please keep in mind the following health maintenance items that you are due for:   Health Maintenance Due  Topic Date Due   HPV VACCINES (3 - 3-dose SCDM series) 08/30/2024     Best Wishes,   Dr. Lang

## 2024-09-02 ENCOUNTER — Ambulatory Visit: Admitting: Family Medicine

## 2024-09-03 ENCOUNTER — Ambulatory Visit: Admitting: Family Medicine

## 2024-09-05 ENCOUNTER — Ambulatory Visit: Admitting: Family Medicine

## 2024-09-05 VITALS — BP 105/66 | HR 67 | Ht 72.0 in | Wt 274.8 lb

## 2024-09-05 DIAGNOSIS — K760 Fatty (change of) liver, not elsewhere classified: Secondary | ICD-10-CM | POA: Diagnosis not present

## 2024-09-05 DIAGNOSIS — E782 Mixed hyperlipidemia: Secondary | ICD-10-CM

## 2024-09-05 DIAGNOSIS — F5081 Binge eating disorder, mild: Secondary | ICD-10-CM | POA: Diagnosis not present

## 2024-09-05 DIAGNOSIS — E66812 Obesity, class 2: Secondary | ICD-10-CM

## 2024-09-05 DIAGNOSIS — D696 Thrombocytopenia, unspecified: Secondary | ICD-10-CM

## 2024-09-05 DIAGNOSIS — G4733 Obstructive sleep apnea (adult) (pediatric): Secondary | ICD-10-CM

## 2024-09-05 DIAGNOSIS — R41840 Attention and concentration deficit: Secondary | ICD-10-CM

## 2024-09-05 DIAGNOSIS — S4991XA Unspecified injury of right shoulder and upper arm, initial encounter: Secondary | ICD-10-CM | POA: Diagnosis not present

## 2024-09-05 MED ORDER — MELOXICAM 15 MG PO TABS: 15.0000 mg | ORAL_TABLET | Freq: Every day | ORAL | 2 refills | Status: AC

## 2024-09-05 NOTE — Patient Instructions (Signed)
 To keep you healthy, please keep in mind the following health maintenance items that you are due for:   Health Maintenance Due  Topic Date Due   COVID-19 Vaccine (7 - 2025-26 season) 04/15/2024   HPV VACCINES (3 - 3-dose SCDM series) 08/30/2024     Best Wishes,   Dr. Lang

## 2024-09-05 NOTE — Progress Notes (Signed)
 "  Established Patient Office Visit  Patient ID: Mark Curry, male    DOB: 1985/10/29  Age: 39 y.o. MRN: [id number] PCP: Sharma Coyer, MD  Chief Complaint  Patient presents with   Medical Management of Chronic Issues   Obesity   Sleep Apnea    Tolerating well and benefiting as he is able to tell the difference if he doesn't use it    Subjective:     HPI  Discussed the use of AI scribe software for clinical note transcription with the patient, who gave verbal consent to proceed.  History of Present Illness Mark Curry is a 39 year old male with obesity and sleep apnea who presents for follow-up regarding CPAP use and weight management.  He has lost six pounds since the last visit, now weighing 274 pounds with a BMI of 37, down from 309 pounds with a BMI of 41 since July 2025. He attributes his weight loss to lifestyle changes, including working with a dietitian, keeping a food journal, and regular exercise, including strength training and cardio. He finds it challenging to distinguish hunger from boredom or stress.  He has a history of mild binge eating disorder for which Vyvanse  was attempted but not covered by insurance. He has concerns about attention deficit and was referred to [facility] but has not yet completed the necessary quiz for evaluation. His energy levels are low, stating 'my energy is like, you know, just making it, just making it through.'  He reports a sore right shoulder, which he attributes to a muscle strain from improper exercise form and subsequent activities such as laying on it and playing with his children. He reports discomfort in his right shoulder without sharp pain, primarily occurring during exertion such as bench pressing. He has taken Aleve  without relief.   He uses a smart scale to track his weight and body metrics, noting a downward trend in weight and BMI. He has not yet contacted the attention specialist for his  attention concerns, despite having a referral and a quiz to complete.     ROS    Objective:     BP 105/66 (BP Location: Left Arm, Patient Position: Sitting, Cuff Size: Large)   Pulse 67   Ht 6' (1.829 m)   Wt 274 lb 12.8 oz (124.6 kg)   SpO2 98%   BMI 37.27 kg/m   BP Readings from Last 3 Encounters:  09/05/24 105/66  06/18/24 111/68  05/31/24 133/85   Wt Readings from Last 3 Encounters:  09/05/24 274 lb 12.8 oz (124.6 kg)  06/18/24 280 lb 14.4 oz (127.4 kg)  06/05/24 276 lb (125.2 kg)     Physical Exam Vitals reviewed.  Constitutional:      General: He is not in acute distress.    Appearance: Normal appearance. He is not ill-appearing.  Cardiovascular:     Rate and Rhythm: Normal rate and regular rhythm.  Pulmonary:     Effort: Pulmonary effort is normal. No respiratory distress.     Breath sounds: No wheezing, rhonchi or rales.  Neurological:     Mental Status: He is alert and oriented to person, place, and time.  Psychiatric:        Mood and Affect: Mood normal.        Behavior: Behavior normal.     Physical Exam   MUSCULOSKELETAL: Right shoulder discomfort, likely muscle strain.   Results for orders placed or performed in visit on 09/05/24  CMP14+EGFR  Result  Value Ref Range   Glucose 81 70 - 99 mg/dL   BUN 13 6 - 20 mg/dL   Creatinine, Ser 9.02 0.76 - 1.27 mg/dL   eGFR 896 >40 fO/fpw/8.26   BUN/Creatinine Ratio 13 9 - 20   Sodium 141 134 - 144 mmol/L   Potassium 4.7 3.5 - 5.2 mmol/L   Chloride 101 96 - 106 mmol/L   CO2 22 20 - 29 mmol/L   Calcium 9.5 8.7 - 10.2 mg/dL   Total Protein 7.9 6.0 - 8.5 g/dL   Albumin 4.8 4.1 - 5.1 g/dL   Globulin, Total 3.1 1.5 - 4.5 g/dL   Bilirubin Total 0.9 0.0 - 1.2 mg/dL   Alkaline Phosphatase 61 47 - 123 IU/L   AST 36 0 - 40 IU/L   ALT 29 0 - 44 IU/L  CBC  Result Value Ref Range   WBC 5.5 3.4 - 10.8 x10E3/uL   RBC 5.64 4.14 - 5.80 x10E6/uL   Hemoglobin 15.7 13.0 - 17.7 g/dL   Hematocrit 50.3 62.4 -  51.0 %   MCV 88 79 - 97 fL   MCH 27.8 26.6 - 33.0 pg   MCHC 31.7 31.5 - 35.7 g/dL   RDW 87.1 88.3 - 84.5 %   Platelets 211 150 - 450 x10E3/uL  Hemoglobin A1c  Result Value Ref Range   Hgb A1c MFr Bld 5.1 4.8 - 5.6 %   Est. average glucose Bld gHb Est-mCnc 100 mg/dL  Lipid panel  Result Value Ref Range   Cholesterol, Total 116 100 - 199 mg/dL   Triglycerides 76 0 - 149 mg/dL   HDL 45 >60 mg/dL   VLDL Cholesterol Cal 15 5 - 40 mg/dL   LDL Chol Calc (NIH) 56 0 - 99 mg/dL   Chol/HDL Ratio 2.6 0.0 - 5.0 ratio    Last CBC Lab Results  Component Value Date   WBC 5.5 09/05/2024   HGB 15.7 09/05/2024   HCT 49.6 09/05/2024   MCV 88 09/05/2024   MCH 27.8 09/05/2024   RDW 12.8 09/05/2024   PLT 211 09/05/2024   Last metabolic panel Lab Results  Component Value Date   GLUCOSE 81 09/05/2024   NA 141 09/05/2024   K 4.7 09/05/2024   CL 101 09/05/2024   CO2 22 09/05/2024   BUN 13 09/05/2024   CREATININE 0.97 09/05/2024   EGFR 103 09/05/2024   CALCIUM 9.5 09/05/2024   PROT 7.9 09/05/2024   ALBUMIN 4.8 09/05/2024   LABGLOB 3.1 09/05/2024   AGRATIO 1.7 11/15/2022   BILITOT 0.9 09/05/2024   ALKPHOS 61 09/05/2024   AST 36 09/05/2024   ALT 29 09/05/2024   ANIONGAP 10 02/03/2020   Last lipids Lab Results  Component Value Date   CHOL 116 09/05/2024   HDL 45 09/05/2024   LDLCALC 56 09/05/2024   TRIG 76 09/05/2024   CHOLHDL 2.6 09/05/2024   Last hemoglobin A1c Lab Results  Component Value Date   HGBA1C 5.1 09/05/2024   Last thyroid functions No results found for: TSH, T3TOTAL, T4TOTAL, FREET4, THYROIDAB    The ASCVD Risk score (Arnett DK, et al., 2019) failed to calculate for the following reasons:   The 2019 ASCVD risk score is only valid for ages 10 to 4  Outpatient Encounter Medications as of 09/05/2024  Medication Sig   Azelastine -Fluticasone  137-50 MCG/ACT SUSP SHAKE LIQUID AND USE 1 SPRAY IN EACH NOSTRIL TWICE DAILY Nasal for 30 Days   benzonatate   (TESSALON ) 200 MG capsule Take 1 capsule (  200 mg total) by mouth 2 (two) times daily as needed for cough.   fluticasone  (FLONASE ) 50 MCG/ACT nasal spray Place 1 spray into both nostrils daily.   levocetirizine (XYZAL) 5 MG tablet Take 5 mg by mouth every evening.   meloxicam  (MOBIC ) 15 MG tablet Take 1 tablet (15 mg total) by mouth daily.   montelukast (SINGULAIR) 10 MG tablet Take 10 mg by mouth daily.   lisdexamfetamine  (VYVANSE ) 30 MG capsule Take 1 capsule (30 mg total) by mouth daily. (Patient not taking: Reported on 09/05/2024)   No facility-administered encounter medications on file as of 09/05/2024.       Assessment & Plan:   Problem List Items Addressed This Visit     Attention and concentration deficit   Fatty liver   Relevant Orders   CMP14+EGFR (Completed)   Mild binge-eating disorder   Obesity, Class II, BMI 35-39.9 - Primary   Relevant Orders   Hemoglobin A1c (Completed)   OSA on CPAP   Thrombocytopenia   Relevant Orders   CBC (Completed)   Other Visit Diagnoses       Injury of right shoulder, initial encounter       Relevant Medications   meloxicam  (MOBIC ) 15 MG tablet     Elevated cholesterol with elevated triglycerides       Relevant Orders   Lipid panel (Completed)       Assessment and Plan Assessment & Plan Obesity, class 2  Fatty liver  Chronic  BMI of 37, down from 41 since July 2025. Weight reduced from 309 pounds to 274 pounds. Actively engaged in lifestyle modifications including diet and exercise. Reports challenges with hunger cues and emotional eating. - Continue current lifestyle modifications including diet and exercise - Continue follow-up with dietitian - recheck CMP today   Obstructive sleep apnea on CPAP Chronic  Stable with CPAP use  Patient advised to continue CPAP nightly  The patient has had significant benefit from the use of CPAP machine with considerable improvements in quality of life, work production and decrease medical  complaints.  The patient will need continued maintenance and care CPAP supplies (including upgrades as deemed appropriate) in order to continue treatment for sleep apnea as this is medically necessary.    Chronic Thrombocytopenia Recheck CBC    Mild binge eating disorder Chronic,improving  Attempted to obtain Vyvanse  for mild binge eating disorder, but it was not covered by insurance. Reports that things have been going fine without medication. - continue to work with nutritionist   Attention and concentration deficit Chronic suspected condition based on symptoms  Referred to [facility] but has not completed the assessment. Discussed potential use of non-stimulant medications like Wellbutrin or Strattera if needed. He is hesitant to complete the assessment quiz. - Encouraged completion of attention specialist assessment quiz - Will consider non-stimulant medications like Wellbutrin or Strattera if needed  Right shoulder strain acute Likely due to improper exercise technique and recent physical activity. Pain is localized to the right shoulder, exacerbated by certain movements, but not associated with weakness. - Prescribed meloxicam  15 mg once daily for inflammation - Advised rest and avoidance of activities that exacerbate pain - Instructed to avoid concurrent use of other NSAIDs while on meloxicam   Mixed hyperlipidemia Chronic  Previous triglycerides at 223. Exercise is expected to improve HDL levels. Fasting is not required for cholesterol testing, but he prefers not to fast. - Ordered lipid panel - Ordered CBC, CMP, and A1c - Encouraged continuation of exercise regimen  Return in about 6 months (around 03/05/2025) for CPE.    Rockie Agent, MD [facility]  "

## 2024-09-06 ENCOUNTER — Ambulatory Visit: Payer: Self-pay | Admitting: Family Medicine

## 2024-09-06 LAB — CBC
Hematocrit: 49.6 % (ref 37.5–51.0)
Hemoglobin: 15.7 g/dL (ref 13.0–17.7)
MCH: 27.8 pg (ref 26.6–33.0)
MCHC: 31.7 g/dL (ref 31.5–35.7)
MCV: 88 fL (ref 79–97)
Platelets: 211 x10E3/uL (ref 150–450)
RBC: 5.64 x10E6/uL (ref 4.14–5.80)
RDW: 12.8 % (ref 11.6–15.4)
WBC: 5.5 x10E3/uL (ref 3.4–10.8)

## 2024-09-06 LAB — CMP14+EGFR
ALT: 29 IU/L (ref 0–44)
AST: 36 IU/L (ref 0–40)
Albumin: 4.8 g/dL (ref 4.1–5.1)
Alkaline Phosphatase: 61 IU/L (ref 47–123)
BUN/Creatinine Ratio: 13 (ref 9–20)
BUN: 13 mg/dL (ref 6–20)
Bilirubin Total: 0.9 mg/dL (ref 0.0–1.2)
CO2: 22 mmol/L (ref 20–29)
Calcium: 9.5 mg/dL (ref 8.7–10.2)
Chloride: 101 mmol/L (ref 96–106)
Creatinine, Ser: 0.97 mg/dL (ref 0.76–1.27)
Globulin, Total: 3.1 g/dL (ref 1.5–4.5)
Glucose: 81 mg/dL (ref 70–99)
Potassium: 4.7 mmol/L (ref 3.5–5.2)
Sodium: 141 mmol/L (ref 134–144)
Total Protein: 7.9 g/dL (ref 6.0–8.5)
eGFR: 103 mL/min/1.73

## 2024-09-06 LAB — LIPID PANEL
Chol/HDL Ratio: 2.6 ratio (ref 0.0–5.0)
Cholesterol, Total: 116 mg/dL (ref 100–199)
HDL: 45 mg/dL
LDL Chol Calc (NIH): 56 mg/dL (ref 0–99)
Triglycerides: 76 mg/dL (ref 0–149)
VLDL Cholesterol Cal: 15 mg/dL (ref 5–40)

## 2024-09-06 LAB — HEMOGLOBIN A1C
Est. average glucose Bld gHb Est-mCnc: 100 mg/dL
Hgb A1c MFr Bld: 5.1 % (ref 4.8–5.6)

## 2024-10-16 ENCOUNTER — Ambulatory Visit: Admitting: Family Medicine

## 2025-03-10 ENCOUNTER — Encounter: Admitting: Family Medicine
